# Patient Record
Sex: Female | Born: 1975 | Race: White | Hispanic: No | Marital: Single | Smoking: Current every day smoker
Health system: Southern US, Community
[De-identification: ages and names within clinical notes are randomized; demographics above are authoritative.]

## PROBLEM LIST (undated history)

## (undated) DIAGNOSIS — N739 Female pelvic inflammatory disease, unspecified: Secondary | ICD-10-CM

## (undated) DIAGNOSIS — N946 Dysmenorrhea, unspecified: Secondary | ICD-10-CM

## (undated) DIAGNOSIS — F32A Depression, unspecified: Secondary | ICD-10-CM

## (undated) DIAGNOSIS — B977 Papillomavirus as the cause of diseases classified elsewhere: Secondary | ICD-10-CM

## (undated) DIAGNOSIS — F419 Anxiety disorder, unspecified: Secondary | ICD-10-CM

## (undated) DIAGNOSIS — Z8742 Personal history of other diseases of the female genital tract: Secondary | ICD-10-CM

## (undated) DIAGNOSIS — F17210 Nicotine dependence, cigarettes, uncomplicated: Secondary | ICD-10-CM

## (undated) DIAGNOSIS — K589 Irritable bowel syndrome without diarrhea: Secondary | ICD-10-CM

## (undated) DIAGNOSIS — G43909 Migraine, unspecified, not intractable, without status migrainosus: Secondary | ICD-10-CM

## (undated) HISTORY — DX: Female pelvic inflammatory disease, unspecified: N73.9

## (undated) HISTORY — DX: Papillomavirus as the cause of diseases classified elsewhere: B97.7

## (undated) HISTORY — DX: Dysmenorrhea, unspecified: N94.6

## (undated) HISTORY — DX: Anxiety disorder, unspecified: F41.9

## (undated) HISTORY — DX: Depression, unspecified: F32.A

## (undated) HISTORY — DX: Migraine, unspecified, not intractable, without status migrainosus: G43.909

## (undated) HISTORY — DX: Personal history of other diseases of the female genital tract: Z87.42

## (undated) HISTORY — DX: Nicotine dependence, cigarettes, uncomplicated: F17.210

## (undated) HISTORY — PX: CERVICAL BIOPSY  W/ LOOP ELECTRODE EXCISION: SUR135

## (undated) HISTORY — DX: Irritable bowel syndrome without diarrhea: K58.9

---

## 1998-07-16 ENCOUNTER — Ambulatory Visit (HOSPITAL_COMMUNITY): Admission: RE | Admit: 1998-07-16 | Discharge: 1998-07-16 | Payer: Self-pay | Admitting: *Deleted

## 1998-07-23 ENCOUNTER — Other Ambulatory Visit: Admission: RE | Admit: 1998-07-23 | Discharge: 1998-07-23 | Payer: Self-pay | Admitting: Obstetrics & Gynecology

## 1999-10-30 ENCOUNTER — Emergency Department (HOSPITAL_COMMUNITY): Admission: EM | Admit: 1999-10-30 | Discharge: 1999-10-30 | Payer: Self-pay | Admitting: Emergency Medicine

## 2000-02-19 ENCOUNTER — Other Ambulatory Visit: Admission: RE | Admit: 2000-02-19 | Discharge: 2000-02-19 | Payer: Self-pay | Admitting: Gynecology

## 2000-09-06 ENCOUNTER — Other Ambulatory Visit: Admission: RE | Admit: 2000-09-06 | Discharge: 2000-09-06 | Payer: Self-pay | Admitting: Obstetrics and Gynecology

## 2001-02-28 ENCOUNTER — Ambulatory Visit (HOSPITAL_COMMUNITY): Admission: RE | Admit: 2001-02-28 | Discharge: 2001-02-28 | Payer: Self-pay | Admitting: Obstetrics & Gynecology

## 2001-02-28 ENCOUNTER — Encounter: Payer: Self-pay | Admitting: Obstetrics & Gynecology

## 2001-03-03 ENCOUNTER — Inpatient Hospital Stay (HOSPITAL_COMMUNITY): Admission: AD | Admit: 2001-03-03 | Discharge: 2001-03-03 | Payer: Self-pay | Admitting: *Deleted

## 2001-03-18 ENCOUNTER — Inpatient Hospital Stay (HOSPITAL_COMMUNITY): Admission: AD | Admit: 2001-03-18 | Discharge: 2001-03-21 | Payer: Self-pay | Admitting: Obstetrics and Gynecology

## 2001-12-12 ENCOUNTER — Other Ambulatory Visit: Admission: RE | Admit: 2001-12-12 | Discharge: 2001-12-12 | Payer: Self-pay | Admitting: Gynecology

## 2002-08-25 ENCOUNTER — Emergency Department (HOSPITAL_COMMUNITY): Admission: EM | Admit: 2002-08-25 | Discharge: 2002-08-25 | Payer: Self-pay | Admitting: Emergency Medicine

## 2002-08-25 ENCOUNTER — Encounter: Payer: Self-pay | Admitting: Emergency Medicine

## 2002-08-28 ENCOUNTER — Ambulatory Visit (HOSPITAL_COMMUNITY): Admission: RE | Admit: 2002-08-28 | Discharge: 2002-08-28 | Payer: Self-pay | Admitting: Emergency Medicine

## 2002-08-28 ENCOUNTER — Encounter: Payer: Self-pay | Admitting: Emergency Medicine

## 2003-02-05 ENCOUNTER — Other Ambulatory Visit: Admission: RE | Admit: 2003-02-05 | Discharge: 2003-02-05 | Payer: Self-pay | Admitting: Gynecology

## 2004-06-24 ENCOUNTER — Emergency Department (HOSPITAL_COMMUNITY): Admission: EM | Admit: 2004-06-24 | Discharge: 2004-06-24 | Payer: Self-pay | Admitting: *Deleted

## 2004-07-01 ENCOUNTER — Ambulatory Visit (HOSPITAL_COMMUNITY): Admission: RE | Admit: 2004-07-01 | Discharge: 2004-07-01 | Payer: Self-pay | Admitting: Obstetrics and Gynecology

## 2005-01-01 ENCOUNTER — Other Ambulatory Visit: Admission: RE | Admit: 2005-01-01 | Discharge: 2005-01-01 | Payer: Self-pay | Admitting: Gynecology

## 2006-03-25 ENCOUNTER — Ambulatory Visit: Payer: Self-pay | Admitting: Family Medicine

## 2006-05-06 ENCOUNTER — Ambulatory Visit: Payer: Self-pay | Admitting: Obstetrics & Gynecology

## 2006-05-06 ENCOUNTER — Other Ambulatory Visit: Admission: RE | Admit: 2006-05-06 | Discharge: 2006-05-06 | Payer: Self-pay | Admitting: Obstetrics and Gynecology

## 2006-05-06 ENCOUNTER — Encounter (INDEPENDENT_AMBULATORY_CARE_PROVIDER_SITE_OTHER): Payer: Self-pay | Admitting: Specialist

## 2006-05-20 ENCOUNTER — Ambulatory Visit: Payer: Self-pay | Admitting: Obstetrics & Gynecology

## 2006-12-07 HISTORY — PX: LEEP: SHX91

## 2007-02-18 ENCOUNTER — Ambulatory Visit: Payer: Self-pay | Admitting: Family Medicine

## 2007-06-20 ENCOUNTER — Ambulatory Visit: Payer: Self-pay | Admitting: Family Medicine

## 2007-08-26 ENCOUNTER — Ambulatory Visit: Payer: Self-pay | Admitting: Family Medicine

## 2007-09-12 ENCOUNTER — Ambulatory Visit: Payer: Self-pay | Admitting: Family Medicine

## 2007-12-05 ENCOUNTER — Ambulatory Visit: Payer: Self-pay | Admitting: Family Medicine

## 2008-02-29 ENCOUNTER — Ambulatory Visit: Payer: Self-pay | Admitting: Family Medicine

## 2008-03-08 ENCOUNTER — Other Ambulatory Visit: Admission: RE | Admit: 2008-03-08 | Discharge: 2008-03-08 | Payer: Self-pay | Admitting: Family Medicine

## 2008-03-08 ENCOUNTER — Ambulatory Visit: Payer: Self-pay | Admitting: Family Medicine

## 2008-03-13 ENCOUNTER — Encounter: Admission: RE | Admit: 2008-03-13 | Discharge: 2008-03-13 | Payer: Self-pay | Admitting: Family Medicine

## 2008-03-21 ENCOUNTER — Ambulatory Visit: Payer: Self-pay | Admitting: Family Medicine

## 2008-05-16 ENCOUNTER — Ambulatory Visit: Payer: Self-pay | Admitting: Family Medicine

## 2008-06-13 ENCOUNTER — Ambulatory Visit: Payer: Self-pay | Admitting: Family Medicine

## 2008-06-21 ENCOUNTER — Ambulatory Visit: Payer: Self-pay | Admitting: Family Medicine

## 2008-07-12 ENCOUNTER — Ambulatory Visit: Payer: Self-pay | Admitting: Family Medicine

## 2008-08-09 ENCOUNTER — Ambulatory Visit: Payer: Self-pay | Admitting: Family Medicine

## 2008-08-21 ENCOUNTER — Ambulatory Visit: Payer: Self-pay | Admitting: Family Medicine

## 2009-01-01 ENCOUNTER — Ambulatory Visit: Payer: Self-pay | Admitting: Family Medicine

## 2009-01-30 ENCOUNTER — Ambulatory Visit: Payer: Self-pay | Admitting: Family Medicine

## 2009-02-12 ENCOUNTER — Ambulatory Visit: Payer: Self-pay | Admitting: Family Medicine

## 2009-03-21 ENCOUNTER — Ambulatory Visit: Payer: Self-pay | Admitting: Family Medicine

## 2009-08-15 ENCOUNTER — Emergency Department (HOSPITAL_COMMUNITY): Admission: EM | Admit: 2009-08-15 | Discharge: 2009-08-15 | Payer: Self-pay | Admitting: Emergency Medicine

## 2010-01-21 ENCOUNTER — Ambulatory Visit: Payer: Self-pay | Admitting: Family Medicine

## 2011-04-24 NOTE — Group Therapy Note (Signed)
NAMEMarland Kitchen  Kristine, Holder NO.:  [id number]   MEDICAL RECORD NO.:  [id number]          PATIENT TYPE:  WOC   LOCATION:  [facility]                   FACILITY:  [facility]   PHYSICIAN:  Tinnie Gens, MD        DATE OF BIRTH:  10-Dec-1975   DATE OF SERVICE:  03/25/2006                                    CLINIC NOTE   CHIEF COMPLAINT:  Referral for LEEP.   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  The patient was  referred today for LEEP.  She is without significant complaints today.   PAST MEDICAL HISTORY:  Negative.   PAST SURGICAL HISTORY:  Negative.   MEDICATIONS:  Depo-Provera.   ALLERGIES:  No known drug allergies.   PAST OBSTETRICAL HISTORY:  She is gravida 2, para 1, with one vaginal  delivery.   GYN HISTORY:  Menarche at age 41, cycles are irregular now that she is on  Depo.  She does have a history of abnormal Paps in the past.   FAMILY HISTORY:  Negative.   SOCIAL HISTORY:  The patient smokes one pack per day.  She does drink  alcohol, 2-3 drinks per week.   REVIEW OF SYSTEMS:  14-point review of systems reviewed.  Please see GYN  history and the chart was negative.   PHYSICAL EXAMINATION:  VITAL SIGNS:  As noted in the chart.  GENERAL:  She is a well-developed, well-nourished white female in no acute  distress.  ABDOMEN:  Soft, nontender, and nondistended.   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   PLAN:  LEEP procedure.  The patient has already reviewed the procedure.  She  will return for a LEEP in the next few weeks.  A limited discussion was also  had with the patient regarding implications of abnormal Pap, HPV effect,  prevention, and follow-up following LEEP.  The patient understands all of  this.  Thank you for this referral.     ______________________________  Tinnie Gens, MD     TP/MEDQ  D:  03/25/2006  T:  03/26/2006  Job:  [id number]   cc:   [facility]

## 2011-04-24 NOTE — Group Therapy Note (Signed)
NAMEMarland Kitchen  Kristine Holder, Kristine Holder NO.:  [id number]   MEDICAL RECORD NO.:  [id number]          PATIENT TYPE:  WOC   LOCATION:  [facility]                   FACILITY:  [facility]   PHYSICIAN:  Dorthula Perfect, MD     DATE OF BIRTH:  [birth year]   DATE OF SERVICE:  05/06/2006                                    CLINIC NOTE   SUBJECTIVE:  This 35 year old white female, para 1, who uses Depo-Provera  for contraception and has not had a menstrual period in some time, returns  today for LEEP.  She had colposcopy done a number of months ago after an  abnormal Pap smear.  Directed biopsies at both 6 and 11 o'clock,  respectively, revealed high-grade squamous lesion, CIN-2, and the cervical  curettings were normal.  The patient has seen the LEEP video.  Her pregnancy  test today is negative.   DESCRIPTION OF PROCEDURE:  Laser speculum is inserted and the cervix  visualized.  Acetic acid was applied and colposcopy revealed some  abnormalities at 6 and 11 o'clock.  The cervix was then injected  circumferentially beginning at 6 o'clock with 2% lidocaine with epinephrine.  After 4 minutes or so to allow adequate absorption, the LEEP was done.  The  20-mm electrode with the setting of 50/50 starting at 3 o'clock and going  across and deeper and exiting at 9 o'clock was performed.  The tissue was  removed in 2-3 fragments.  The ball electrode was then used to coagulate the  base.  Thick Monsel solution was then applied.  Bleeding was negligible.   DIAGNOSIS:  Cervical intraepithelial neoplasia, grade 2.   PROCEDURE:  Loop electrosurgical excision procedure.   DISPOSITION:  The patient will be rechecked in 2 weeks.  She was instructed  to refrain for sexual intercourse.  If she has an abnormal amount of heavy  bleeding, she is to come back to be checked.           ______________________________  Dorthula Perfect, MD     ER/MEDQ  D:  05/06/2006  T:  05/07/2006  Job:  [id number]

## 2011-04-24 NOTE — Group Therapy Note (Signed)
NAMEMarland Kitchen  Kristine, Holder NO.:  [id number]   MEDICAL RECORD NO.:  [id number]          PATIENT TYPE:  WOC   LOCATION:  [facility]                   FACILITY:  [facility]   PHYSICIAN:  Dorthula Perfect, MD     DATE OF BIRTH:  [birth year]   DATE OF SERVICE:  05/20/2006                                    CLINIC NOTE   A 35 year old white female, para 1, had a LEEP procedure done here on May  31st. It was performed for CIN-II disease. Pathology revealed a high-grade  squamous lesion, the margins of which were free. The specimen was  fragmented.   The patient does not have a menstrual period because of Depo-Provera. She  does have a  malodorous vaginal discharge. She has no vaginal spotting.   Speculum examination reveals the cervix to healing well from the LEEP. There  is no bleeding noted. There is a malodorous discharge. The uterus is normal  size and shape, and is nontender.   IMPRESSION:  Cervical dysphagia, CIN-II, status post LEEP.   DISPOSITION:  1.  Sulfa vaginal cream, half an applicator at bedtime for the next two      weeks.  2.  Return for Pap smear in three to four months.  3.  No sexual intercourse for the next two to four weeks.           ______________________________  Dorthula Perfect, MD     ER/MEDQ  D:  05/20/2006  T:  05/20/2006  Job:  [id number]

## 2011-10-20 LAB — HM HIV SCREENING LAB: HM HIV Screening: NEGATIVE

## 2014-04-02 DIAGNOSIS — J309 Allergic rhinitis, unspecified: Secondary | ICD-10-CM | POA: Insufficient documentation

## 2014-10-18 LAB — HM PAP SMEAR

## 2016-12-14 DIAGNOSIS — J0100 Acute maxillary sinusitis, unspecified: Secondary | ICD-10-CM | POA: Diagnosis not present

## 2016-12-14 DIAGNOSIS — H66002 Acute suppurative otitis media without spontaneous rupture of ear drum, left ear: Secondary | ICD-10-CM | POA: Diagnosis not present

## 2016-12-14 DIAGNOSIS — J209 Acute bronchitis, unspecified: Secondary | ICD-10-CM | POA: Diagnosis not present

## 2016-12-17 DIAGNOSIS — R05 Cough: Secondary | ICD-10-CM | POA: Diagnosis not present

## 2016-12-17 DIAGNOSIS — H66002 Acute suppurative otitis media without spontaneous rupture of ear drum, left ear: Secondary | ICD-10-CM | POA: Diagnosis not present

## 2016-12-17 DIAGNOSIS — J0100 Acute maxillary sinusitis, unspecified: Secondary | ICD-10-CM | POA: Diagnosis not present

## 2017-01-08 DIAGNOSIS — J0100 Acute maxillary sinusitis, unspecified: Secondary | ICD-10-CM | POA: Diagnosis not present

## 2017-04-01 DIAGNOSIS — L508 Other urticaria: Secondary | ICD-10-CM | POA: Diagnosis not present

## 2017-09-08 DIAGNOSIS — R350 Frequency of micturition: Secondary | ICD-10-CM | POA: Diagnosis not present

## 2017-09-08 DIAGNOSIS — L0100 Impetigo, unspecified: Secondary | ICD-10-CM | POA: Diagnosis not present

## 2017-09-08 DIAGNOSIS — J0100 Acute maxillary sinusitis, unspecified: Secondary | ICD-10-CM | POA: Diagnosis not present

## 2017-09-23 DIAGNOSIS — R300 Dysuria: Secondary | ICD-10-CM | POA: Diagnosis not present

## 2017-09-28 ENCOUNTER — Encounter (HOSPITAL_COMMUNITY): Payer: Self-pay | Admitting: Emergency Medicine

## 2017-09-28 ENCOUNTER — Emergency Department (HOSPITAL_COMMUNITY): Payer: 59

## 2017-09-28 ENCOUNTER — Emergency Department (HOSPITAL_COMMUNITY)
Admission: EM | Admit: 2017-09-28 | Discharge: 2017-09-29 | Disposition: A | Discharge status: Home / Self Care | Payer: 59 | Attending: Emergency Medicine | Admitting: Emergency Medicine

## 2017-09-28 DIAGNOSIS — N10 Acute tubulo-interstitial nephritis: Secondary | ICD-10-CM | POA: Diagnosis not present

## 2017-09-28 DIAGNOSIS — N12 Tubulo-interstitial nephritis, not specified as acute or chronic: Secondary | ICD-10-CM

## 2017-09-28 DIAGNOSIS — M545 Low back pain: Secondary | ICD-10-CM | POA: Diagnosis present

## 2017-09-28 DIAGNOSIS — R1031 Right lower quadrant pain: Secondary | ICD-10-CM | POA: Insufficient documentation

## 2017-09-28 DIAGNOSIS — F17210 Nicotine dependence, cigarettes, uncomplicated: Secondary | ICD-10-CM | POA: Diagnosis not present

## 2017-09-28 LAB — URINALYSIS, ROUTINE W REFLEX MICROSCOPIC
Bilirubin Urine: NEGATIVE
Glucose, UA: NEGATIVE mg/dL
Hgb urine dipstick: NEGATIVE
Ketones, ur: NEGATIVE mg/dL
Nitrite: NEGATIVE
Protein, ur: NEGATIVE mg/dL
Specific Gravity, Urine: 1.013 (ref 1.005–1.030)
pH: 7 (ref 5.0–8.0)

## 2017-09-28 LAB — CBC WITH DIFFERENTIAL/PLATELET
Basophils Absolute: 0 10*3/uL (ref 0.0–0.1)
Basophils Relative: 1 %
Eosinophils Absolute: 0.3 10*3/uL (ref 0.0–0.7)
Eosinophils Relative: 3 %
HCT: 39.5 % (ref 36.0–46.0)
Hemoglobin: 13.3 g/dL (ref 12.0–15.0)
Lymphocytes Relative: 39 %
Lymphs Abs: 3.4 10*3/uL (ref 0.7–4.0)
MCH: 30.1 pg (ref 26.0–34.0)
MCHC: 33.7 g/dL (ref 30.0–36.0)
MCV: 89.4 fL (ref 78.0–100.0)
Monocytes Absolute: 0.7 10*3/uL (ref 0.1–1.0)
Monocytes Relative: 9 %
Neutro Abs: 4.3 10*3/uL (ref 1.7–7.7)
Neutrophils Relative %: 48 %
Platelets: 356 10*3/uL (ref 150–400)
RBC: 4.42 MIL/uL (ref 3.87–5.11)
RDW: 13.7 % (ref 11.5–15.5)
WBC: 8.7 10*3/uL (ref 4.0–10.5)

## 2017-09-28 LAB — COMPREHENSIVE METABOLIC PANEL
ALT: 25 U/L (ref 14–54)
AST: 25 U/L (ref 15–41)
Albumin: 4.2 g/dL (ref 3.5–5.0)
Alkaline Phosphatase: 46 U/L (ref 38–126)
Anion gap: 7 (ref 5–15)
BUN: 12 mg/dL (ref 6–20)
CO2: 25 mmol/L (ref 22–32)
Calcium: 9.6 mg/dL (ref 8.9–10.3)
Chloride: 104 mmol/L (ref 101–111)
Creatinine, Ser: 0.75 mg/dL (ref 0.44–1.00)
GFR calc Af Amer: >60 mL/min (ref >60)
GFR calc non Af Amer: >60 mL/min (ref >60)
Glucose, Bld: 87 mg/dL (ref 65–99)
Potassium: 4 mmol/L (ref 3.5–5.1)
Sodium: 136 mmol/L (ref 135–145)
Total Bilirubin: 0.4 mg/dL (ref 0.3–1.2)
Total Protein: 7.1 g/dL (ref 6.5–8.1)

## 2017-09-28 LAB — LIPASE, BLOOD: Lipase: 47 U/L (ref 11–51)

## 2017-09-28 LAB — I-STAT BETA HCG BLOOD, ED (MC, WL, AP ONLY): I-stat hCG, quantitative: <5 m[IU]/mL (ref <5)

## 2017-09-28 MED ORDER — TRAMADOL HCL 50 MG PO TABS: 50.0000 mg | ORAL_TABLET | Freq: Four times a day (QID) | ORAL | 0 refills | Status: DC | PRN

## 2017-09-28 MED ORDER — SODIUM CHLORIDE 0.9 % IV BOLUS (SEPSIS)
1000.0000 mL | Freq: Once | INTRAVENOUS | Status: AC
  Administered 2017-09-28: 1000 mL via INTRAVENOUS

## 2017-09-28 MED ORDER — IOPAMIDOL (ISOVUE-300) INJECTION 61%
INTRAVENOUS | Status: AC
  Filled 2017-09-28: qty 100

## 2017-09-28 MED ORDER — ONDANSETRON HCL 4 MG/2ML IJ SOLN
4.0000 mg | Freq: Once | INTRAMUSCULAR | Status: AC
  Administered 2017-09-28: 4 mg via INTRAVENOUS
  Filled 2017-09-28: qty 2

## 2017-09-28 MED ORDER — MORPHINE SULFATE (PF) 4 MG/ML IV SOLN
4.0000 mg | Freq: Once | INTRAVENOUS | Status: AC
  Administered 2017-09-28: 4 mg via INTRAVENOUS
  Filled 2017-09-28: qty 1

## 2017-09-28 MED ORDER — DEXTROSE 5 % IV SOLN
1.0000 g | Freq: Once | INTRAVENOUS | Status: AC
  Administered 2017-09-28: 1 g via INTRAVENOUS
  Filled 2017-09-28: qty 10

## 2017-09-28 MED ORDER — IOPAMIDOL (ISOVUE-300) INJECTION 61%
INTRAVENOUS | Status: DC
Start: 2017-09-28 — End: 2017-09-29
  Filled 2017-09-28: qty 30

## 2017-09-28 MED ORDER — CEPHALEXIN 500 MG PO CAPS: 500.0000 mg | ORAL_CAPSULE | Freq: Four times a day (QID) | ORAL | 0 refills | Status: AC

## 2017-09-28 NOTE — Discharge Instructions (Signed)
There is evidence of a kidney infection called pyelonephritis. Please be sure to stay well hydrated by drinking plenty of water. Tried to have a goal of at least a half liter of water an hour.   Please stop taking the Cipro. Begin taking the cephalexin (Keflex).  Please take all of your antibiotics until finished!   You may develop abdominal discomfort or diarrhea from the antibiotic.  You may help offset this with probiotics which you can buy or get in yogurt. Do not eat or take the probiotics until 2 hours after your antibiotic.   Antiinflammatory medications: Take 600 mg of ibuprofen every 6 hours or 440 mg (over the counter dose) to 500 mg (prescription dose) of naproxen every 12 hours for the next 3 days. After this time, these medications may be used as needed for pain. Take these medications with food to avoid upset stomach. Choose only one of these medications, do not take them together. Tylenol: Should you continue to have additional pain while taking the ibuprofen or naproxen, you may add in tylenol as needed. Your daily total maximum amount of tylenol from all sources should be limited to 4000mg /day for persons without liver problems, or 2000mg /day for those with liver problems. Tramadol: May use the tramadol for severe pain. Do not drive or perform other dangerous activities while taking the tramadol.  Follow-up with your primary care provider next week. Return to the ED should symptoms worsen or you begin to have fever, vomiting, difficulty urinating, increasing pain, or any other major concerns.

## 2017-09-28 NOTE — ED Provider Notes (Signed)
[facility]-EMERGENCY DEPT Provider Note   CSN: [id number] Arrival date & time: 09/28/17  1524     History   Chief Complaint Chief Complaint  Patient presents with  . Flank Pain    HPI Kristine Holder is a 41 y.o. female.  HPI   Kristine Holder is a 41 y.o. female, presenting to the ED with right lower back pain for the last two weeks. Pain started in the right lower quadrant it has since moved to sit mostly in the right lower back, radiating into the right flank, RLQ, and suprapubic region.  Also endorses urinary frequency, intermittent subjective fever, and nausea. Initially thought she had an UTI, was seen by PCP on Oct 18, and prescribed Cipro 500 mg BID. Symptoms have worsened since that time. Pain is rated 8/10, alternates between a dullness and sharp, stabbing pain.  Denies hematuria, vomiting, diarrhea (except from ABX), abnormal vaginal discharge/bleeding, hematochezia/melena, anorexia, CP, SOB, or any other complaints.   Does not have a menstrual cycle due to Depo. Last food was around 5 pm this evening.     History reviewed. No pertinent past medical history.  There are no active problems to display for this patient.   History reviewed. No pertinent surgical history.  OB History    No data available       Home Medications    Prior to Admission medications   Medication Sig Start Date End Date Taking? Authorizing Provider  cephALEXin (KEFLEX) 500 MG capsule Take 1 capsule (500 mg total) by mouth 4 (four) times daily. 09/28/17 10/12/17  Joy, Shawn C, PA-C  traMADol (ULTRAM) 50 MG tablet Take 1 tablet (50 mg total) by mouth every 6 (six) hours as needed. 09/28/17   Joy, Hillard Danker, PA-C    Family History No family history on file.  Social History Social History  Substance Use Topics  . Smoking status: Current Every Day Smoker    Types: Cigarettes  . Smokeless tobacco: Never Used  . Alcohol use No     Allergies    Prednisone   Review of Systems Review of Systems  Constitutional: Positive for fever (intermittent, subjective). Negative for chills and diaphoresis.  Respiratory: Negative for shortness of breath.   Cardiovascular: Negative for chest pain.  Gastrointestinal: Positive for abdominal pain and nausea. Negative for blood in stool and vomiting.  Genitourinary: Positive for flank pain and frequency. Negative for difficulty urinating, dysuria, hematuria, vaginal bleeding and vaginal discharge.  Musculoskeletal: Positive for back pain.  All other systems reviewed and are negative.    Physical Exam Updated Vital Signs BP 126/72 (BP Location: Left Arm)   Pulse 87   Temp 97.9 F (36.6 C) (Oral)   Resp 18   Ht 5\' 2"  (1.575 m)   Wt 47.9 kg (105 lb 8 oz)   SpO2 100%   BMI 19.30 kg/m   Physical Exam  Constitutional: She appears well-developed and well-nourished. No distress.  HENT:  Head: Normocephalic and atraumatic.  Eyes: Conjunctivae are normal.  Neck: Neck supple.  Cardiovascular: Normal rate, regular rhythm, normal heart sounds and intact distal pulses.   Pulmonary/Chest: Effort normal and breath sounds normal. No respiratory distress.  Abdominal: Soft. There is tenderness. There is rebound and CVA tenderness (right). There is no guarding.    Rebound tenderness in the RLQ.  Musculoskeletal: She exhibits no edema.       Arms: Lymphadenopathy:    She has no cervical adenopathy.  Neurological: She is alert.  Skin: Skin is warm and dry. She is not diaphoretic.  Psychiatric: She has a normal mood and affect. Her behavior is normal.  Nursing note and vitals reviewed.    ED Treatments / Results  Labs (all labs ordered are listed, but only abnormal results are displayed) Labs Reviewed  URINALYSIS, ROUTINE W REFLEX MICROSCOPIC - Abnormal; Notable for the following:       Result Value   APPearance HAZY (*)    Leukocytes, UA MODERATE (*)    Bacteria, UA MANY (*)    Squamous  Epithelial / LPF 0-5 (*)    All other components within normal limits  URINE CULTURE  COMPREHENSIVE METABOLIC PANEL  LIPASE, BLOOD  CBC WITH DIFFERENTIAL/PLATELET  I-STAT BETA HCG BLOOD, ED ([facility], [facility], [facility] ONLY)    EKG  EKG Interpretation None       Radiology Ct Abdomen Pelvis W Contrast  Result Date: 09/28/2017 CLINICAL DATA:  Right lower quadrant pain for 2 weeks EXAM: CT ABDOMEN AND PELVIS WITH CONTRAST TECHNIQUE: Multidetector CT imaging of the abdomen and pelvis was performed using the standard protocol following bolus administration of intravenous contrast. CONTRAST:  100 mL Isovue-300 intravenous COMPARISON:  None. FINDINGS: Lower chest: No acute abnormality. Hepatobiliary: No focal liver abnormality is seen. No gallstones, gallbladder wall thickening, or biliary dilatation. Pancreas: Unremarkable. No pancreatic ductal dilatation or surrounding inflammatory changes. Spleen: Normal in size without focal abnormality. Adrenals/Urinary Tract: Adrenal glands are unremarkable. Kidneys are normal, without renal calculi, focal lesion, or hydronephrosis. Bladder is unremarkable. Stomach/Bowel: Stomach is within normal limits. Appendix appears normal. No evidence of bowel wall thickening, distention, or inflammatory changes. Vascular/Lymphatic: No significant vascular findings are present. No enlarged abdominal or pelvic lymph nodes. Reproductive: Uterus and bilateral adnexa are unremarkable. Other: Negative for free air or free fluid. Tiny fat in the umbilicus. Musculoskeletal: No acute or significant osseous findings. IMPRESSION: No CT evidence for acute intra-abdominal or pelvic abnormality. Negative appendix. Electronically Signed   By: Jasmine Pang M.D.   On: 09/28/2017 22:24    Procedures Procedures (including critical care time)  Medications Ordered in ED Medications  iopamidol (ISOVUE-300) 61 % injection (not administered)  iopamidol (ISOVUE-300) 61 % injection (not administered)   sodium chloride 0.9 % bolus 1,000 mL (0 mLs Intravenous Stopped 09/28/17 2146)  ondansetron (ZOFRAN) injection 4 mg (4 mg Intravenous Given 09/28/17 2008)  morphine 4 MG/ML injection 4 mg (4 mg Intravenous Given 09/28/17 2008)  morphine 4 MG/ML injection 4 mg (4 mg Intravenous Given 09/28/17 2145)  cefTRIAXone (ROCEPHIN) 1 g in dextrose 5 % 50 mL IVPB (0 g Intravenous Stopped 09/29/17 0004)     Initial Impression / Assessment and Plan / ED Course  I have reviewed the triage vital signs and the nursing notes.  Pertinent labs & imaging results that were available during my care of the patient were reviewed by me and considered in my medical decision making (see chart for details).  Clinical Course as of Sep 29 6  Tue Sep 28, 2017  2045 Patient states her pain is now 7/10.   [SJ]    Clinical Course User Index [SJ] Joy, Shawn C, PA-C    Patient presents with worsening flank pain. Patient is nontoxic appearing, afebrile, not tachycardic, not tachypneic, not hypotensive, and is in no apparent distress. No leukocytosis. Suspect pyelonephritis, supported by UA findings. Discontinue Cipro and begin Keflex. PCP follow-up. The patient was given instructions for home care as well as return precautions. Patient voices understanding of these  instructions, accepts the plan, and is comfortable with discharge.  Findings and plan of care discussed with Theda Belfasthris Tegeler, MD.    Vitals:   09/28/17 1845 09/28/17 2145 09/28/17 2311 09/29/17 0001  BP: 140/75 130/80 119/60 118/69  Pulse: 80 66 69 77  Resp: 18 14 18 18   Temp: 98.3 F (36.8 C)   97.7 F (36.5 C)  TempSrc: Oral   Oral  SpO2: 100% 98% 98% 99%  Weight:      Height:         Final Clinical Impressions(s) / ED Diagnoses   Final diagnoses:  Pyelonephritis    New Prescriptions New Prescriptions   CEPHALEXIN (KEFLEX) 500 MG CAPSULE    Take 1 capsule (500 mg total) by mouth 4 (four) times daily.   TRAMADOL (ULTRAM) 50 MG TABLET     Take 1 tablet (50 mg total) by mouth every 6 (six) hours as needed.     Anselm PancoastJoy, Shawn C, PA-C 09/29/17 0015    Tegeler, Canary Brimhristopher J, MD 09/29/17 [phone]

## 2017-09-28 NOTE — ED Triage Notes (Signed)
Patient c/o right flank pain that has been ongoing since last Wed. Saw her doctor who ruled out UTI but since pain persist told to come to ED for further work up.

## 2017-09-30 LAB — URINE CULTURE: Culture: NO GROWTH

## 2017-12-01 DIAGNOSIS — J0140 Acute pansinusitis, unspecified: Secondary | ICD-10-CM | POA: Diagnosis not present

## 2018-01-13 DIAGNOSIS — Z3042 Encounter for surveillance of injectable contraceptive: Secondary | ICD-10-CM

## 2018-01-13 HISTORY — DX: Encounter for surveillance of injectable contraceptive: Z30.42

## 2018-05-17 ENCOUNTER — Ambulatory Visit (INDEPENDENT_AMBULATORY_CARE_PROVIDER_SITE_OTHER): Payer: 59 | Admitting: Family Medicine

## 2018-05-17 ENCOUNTER — Other Ambulatory Visit: Payer: Self-pay

## 2018-05-17 ENCOUNTER — Encounter: Payer: Self-pay | Admitting: Family Medicine

## 2018-05-17 VITALS — BP 110/70 | HR 82 | Temp 98.2°F | Ht 64.0 in | Wt 105.2 lb

## 2018-05-17 DIAGNOSIS — M7711 Lateral epicondylitis, right elbow: Secondary | ICD-10-CM

## 2018-05-17 DIAGNOSIS — K580 Irritable bowel syndrome with diarrhea: Secondary | ICD-10-CM

## 2018-05-17 DIAGNOSIS — R0789 Other chest pain: Secondary | ICD-10-CM | POA: Diagnosis not present

## 2018-05-17 DIAGNOSIS — G5601 Carpal tunnel syndrome, right upper limb: Secondary | ICD-10-CM | POA: Diagnosis not present

## 2018-05-17 DIAGNOSIS — F17210 Nicotine dependence, cigarettes, uncomplicated: Secondary | ICD-10-CM | POA: Diagnosis not present

## 2018-05-17 DIAGNOSIS — Z87898 Personal history of other specified conditions: Secondary | ICD-10-CM

## 2018-05-17 DIAGNOSIS — K589 Irritable bowel syndrome without diarrhea: Secondary | ICD-10-CM | POA: Insufficient documentation

## 2018-05-17 DIAGNOSIS — Z8742 Personal history of other diseases of the female genital tract: Secondary | ICD-10-CM

## 2018-05-17 HISTORY — DX: Personal history of other diseases of the female genital tract: Z87.42

## 2018-05-17 HISTORY — DX: Irritable bowel syndrome, unspecified: K58.9

## 2018-05-17 HISTORY — DX: Nicotine dependence, cigarettes, uncomplicated: F17.210

## 2018-05-17 NOTE — Patient Instructions (Addendum)
It was so good seeing you again! Thank you for establishing with my new practice and allowing me to continue caring for you. It means a lot to me.   Please schedule a follow up appointment with me in 4 weeks for your annual complete physical; please come fasting.  Start icing your elbow and wearing a tennis elbow strap at work and a wrist splint at night while you sleep.  Take the diclofenac twice a day for two weeks.  Return for recheck if your pain persists.   Please go to our [facility] office to get your xrays done. You can walk in M-F between 8am and 5pm. Tell them you are there for xrays ordered by me. They will send me the results, then I will let you know the results with instructions.   Address: [address] [place], [place], [place] [phone]  (office sits at [address] at [address] [employer] intersection; from here, turn left onto [address] take to [address], turn right and go for a mile or so, office will be on left across form [address] )  Tennis Elbow Tennis elbow (lateral epicondylitis) is inflammation of the outer tendons of your forearm close to your elbow. Your tendons attach your muscles to your bones. The outer tendons of your forearm are used to extend your wrist, and they attach on the outside part of your elbow. Tennis elbow is often found in people who play tennis, but anyone may get the condition from repeatedly extending the wrist or turning the forearm. What are the causes? This condition is caused by repeatedly extending your wrist and using your hands. It can result from sports or work that requires repetitive forearm movements. Tennis elbow may also be caused by an injury. What increases the risk? You have a higher risk of developing tennis elbow if you play tennis or another racquet sport. You also have a higher risk if you frequently use your hands for work. This condition is also more likely to develop  in:  Musicians.  Carpenters, painters, and plumbers.  Cooks.  Cashiers.  People who work in [employer].  Holiday representative workers.  Butchers.  People who use computers.  What are the signs or symptoms? Symptoms of this condition include:  Pain and tenderness in your forearm and the outer part of your elbow. You may only feel the pain when you use your arm, or you may feel it even when you are not using your arm.  A burning feeling that runs from your elbow through your arm.  Weak grip in your hands.  How is this diagnosed? This condition may be diagnosed by medical history and physical exam. You may also have other tests, including:  X-rays.  MRI.  How is this treated? Your health care provider will recommend lifestyle adjustments, such as resting and icing your arm. Treatment may also include:  Medicines for inflammation. This may include shots of cortisone if your pain continues.  Physical therapy. This may include massage or exercises.  An elbow brace.  Surgery may eventually be recommended if your pain does not go away with treatment. Follow these instructions at home: Activity  Rest your elbow and wrist as directed by your health care provider. Try to avoid any activities that caused the problem until your health care provider says that you can do them again.  If a physical therapist teaches you exercises, do all of them as directed.  If you lift an  object, lift it with your palm facing upward. This lowers the stress on your elbow. Lifestyle  If your tennis elbow is caused by sports, check your equipment and make sure that: ? You are using it correctly. ? It is the best fit for you.  If your tennis elbow is caused by work, take breaks frequently, if you are able. Talk with your manager about how to best perform tasks in a way that is safe. ? If your tennis elbow is caused by computer use, talk with your manager about any changes that can be made to your work  environment. General instructions  If directed, apply ice to the painful area: ? Put ice in a plastic bag. ? Place a towel between your skin and the bag. ? Leave the ice on for 20 minutes, 2-3 times per day.  Take medicines only as directed by your health care provider.  If you were given a brace, wear it as directed by your health care provider.  Keep all follow-up visits as directed by your health care provider. This is important. Contact a health care provider if:  Your pain does not get better with treatment.  Your pain gets worse.  You have numbness or weakness in your forearm, hand, or fingers. This information is not intended to replace advice given to you by your health care provider. Make sure you discuss any questions you have with your health care provider. Document Released: 11/23/2005 Document Revised: 07/23/2016 Document Reviewed: 11/19/2014 Elsevier Interactive Patient Education  2018 [employer]  Carpal Tunnel Syndrome Carpal tunnel syndrome is a condition that causes pain in your hand and arm. The carpal tunnel is a narrow area located on the palm side of your wrist. Repeated wrist motion or certain diseases may cause swelling within the tunnel. This swelling pinches the main nerve in the wrist (median nerve). What are the causes? This condition may be caused by:  Repeated wrist motions.  Wrist injuries.  Arthritis.  A cyst or tumor in the carpal tunnel.  Fluid buildup during pregnancy.  Sometimes the cause of this condition is not known. What increases the risk? This condition is more likely to develop in:  People who have jobs that cause them to repeatedly move their wrists in the same motion, such as Health visitorbutchers and cashiers.  Women.  People with certain conditions, such as: ? Diabetes. ? Obesity. ? An underactive thyroid (hypothyroidism). ? Kidney failure.  What are the signs or symptoms? Symptoms of this condition include:  A tingling  feeling in your fingers, especially in your thumb, index, and middle fingers.  Tingling or numbness in your hand.  An aching feeling in your entire arm, especially when your wrist and elbow are bent for long periods of time.  Wrist pain that goes up your arm to your shoulder.  Pain that goes down into your palm or fingers.  A weak feeling in your hands. You may have trouble grabbing and holding items.  Your symptoms may feel worse during the night. How is this diagnosed? This condition is diagnosed with a medical history and physical exam. You may also have tests, including:  An electromyogram (EMG). This test measures electrical signals sent by your nerves into the muscles.  X-rays.  How is this treated? Treatment for this condition includes:  Lifestyle changes. It is important to stop doing or modify the activity that caused your condition.  Physical or occupational therapy.  Medicines for pain and inflammation. This may include medicine  that is injected into your wrist.  A wrist splint.  Surgery.  Follow these instructions at home: If you have a splint:  Wear it as told by your health care provider. Remove it only as told by your health care provider.  Loosen the splint if your fingers become numb and tingle, or if they turn cold and blue.  Keep the splint clean and dry. General instructions  Take over-the-counter and prescription medicines only as told by your health care provider.  Rest your wrist from any activity that may be causing your pain. If your condition is work related, talk to your employer about changes that can be made, such as getting a wrist pad to use while typing.  If directed, apply ice to the painful area: ? Put ice in a plastic bag. ? Place a towel between your skin and the bag. ? Leave the ice on for 20 minutes, 2-3 times per day.  Keep all follow-up visits as told by your health care provider. This is important.  Do any exercises as  told by your health care provider, physical therapist, or occupational therapist. Contact a health care provider if:  You have new symptoms.  Your pain is not controlled with medicines.  Your symptoms get worse. This information is not intended to replace advice given to you by your health care provider. Make sure you discuss any questions you have with your health care provider. Document Released: 11/20/2000 Document Revised: 04/02/2016 Document Reviewed: 04/10/2015 Elsevier Interactive Patient Education  [employer].

## 2018-05-17 NOTE — Progress Notes (Signed)
Subjective  CC:  Chief Complaint  Patient presents with  . Establish Care    [facility], last physical in 2015, pap due & mammogram, wants to do CPE   . Arm Pain    throbbing and tinging in Right Arm, not conistant x 4-5 months     HPI: Kristine Holder is a 42 y.o. female is a former [facility] patient and is here to reestablish care with me today.   She has the following concerns or needs:  Complains of right arm pain throbbing in multiple locations on and off over the last year or so but has worsened over the last 4 to 5 months.  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  She denies injury.  She reports shoulder pain at times elbow pain and numbness tingling in the fingers, worse at night.  No weakness.  No swelling in any of her joints.  Has not use any medications or treatments.  She is left-handed.  History of abnormal Pap smear: Status post LEEP in 2008, review of medical record shows 2015 with normal Pap with positive HR HPV.  No follow-up was done at that time.  Contraception: Uses Depo-Provera, amenorrheic, smoker.  Not currently sexually active, due next month for injection  Long-term smoker: Reports intermittent and occasional fleeting heaviness in the chest unrelated to exertional activities.  No diaphoresis, nausea or radiation of pain.  Feels it is related to her smoking.  She admits to daily coughing in the morning.  No wheezing or shortness of breath.  Last chest x-ray was about 10 years ago and was normal at that time.  She would like to quit, has failed multiple times with the use of Chantix and vaping.  Smoking is a stress reliever for her.  Health maintenance: Due for complete physical with Pap smear, due for mammogram and lab work.  Assessment  1. Right lateral epicondylitis   2. Cigarette nicotine dependence without complication   3. History of abnormal cervical Pap smear   4. Irritable bowel syndrome with diarrhea   5. Right carpal tunnel syndrome   6.  Atypical chest pain      Plan   Tennis elbow and carpal tunnel syndrome: Educated.  Rice therapy with NSAIDs.  Tennis elbow strap while at work and nighttime wrist splint.  Recheck 2 weeks.  Educational handouts provided  Cigarette dependence with possible related chest pain.  Check chest x-ray for COPD changes.  Counseling for cessation started.  Normal EKG today.  Unlikely to be related to ischemic chest pain.  Check labs at next visit.   Follow up:  Return in about 1 month (around 06/14/2018) for complete physical.  Orders Placed This Encounter  Procedures  . DG Chest 2 View  . HM HIV SCREENING LAB  . HM PAP SMEAR  . EKG 12-Lead   No orders of the defined types were placed in this encounter.     We updated and reviewed the patient's past history in detail and it is documented below.  Patient Active Problem List   Diagnosis Date Noted  . Dependence on nicotine from cigarettes 05/17/2018  . History of abnormal cervical Pap smear 05/17/2018    H/o LEEP 2008 with nl f/u until 2015: nl pap with + HR HPV - no follow up.   . Irritable bowel syndrome (IBS) 05/17/2018  . Encounter for Depo-Provera contraception 01/13/2018  . Allergic rhinitis 04/02/2014   Health Maintenance  Topic Date Due  .  MAMMOGRAM  09/24/1993  . PAP SMEAR  10/18/2017  . INFLUENZA VACCINE  07/07/2018  . TETANUS/TDAP  01/07/2021  . HIV Screening  Completed   Immunization History  Administered Date(s) Administered  . Td 01/07/2011   No outpatient medications have been marked as taking for the 05/17/18 encounter (Office Visit) with Willow Ora, MD.    Allergies: Patient is allergic to prednisone. Past Medical History Patient  has a past medical history of Dependence on nicotine from cigarettes (05/17/2018), History of abnormal cervical Pap smear (05/17/2018), Human papilloma virus (HPV) infection, Irritable bowel syndrome (IBS) (05/17/2018), and Migraine. Past Surgical History Patient  has a past  surgical history that includes LEEP (2008). Family History: Patient family history includes Alcohol abuse in her maternal grandfather and paternal grandfather; COPD in her paternal grandmother; Healthy in her son; Hypertension in her father; Kidney disease in her mother; Learning disabilities in her mother. Social History:  Patient  reports that she has been smoking cigarettes.  She has a 24.00 pack-year smoking history. She has never used smokeless tobacco. She reports that she drinks alcohol. She reports that she does not use drugs.  Review of Systems: Constitutional: negative for fever or malaise Ophthalmic: negative for photophobia, double vision or loss of vision Cardiovascular: negative for chest pain, dyspnea on exertion, or new LE swelling Respiratory: negative for SOB or persistent cough Gastrointestinal: negative for abdominal pain, change in bowel habits or melena Genitourinary: negative for dysuria or gross hematuria Musculoskeletal: negative for new gait disturbance or muscular weakness Integumentary: negative for new or persistent rashes Neurological: negative for TIA or stroke symptoms Psychiatric: negative for SI or delusions Allergic/Immunologic: negative for hives  Patient Care Team    Relationship Specialty Notifications Start End  [facility] PCP - General Family Medicine  09/28/17     Objective  Vitals: BP 110/70   Pulse 82   Temp 98.2 F (36.8 C)   Ht 5\' 4"  (1.626 m)   Wt 105 lb 3.2 oz (47.7 kg)   BMI 18.06 kg/m  General:  Well developed, well nourished, no acute distress  Psych:  Alert and oriented,normal mood and affect Cardiovascular:  RRR without gallop, rub or murmur, nondisplaced PMI Respiratory:  Good breath sounds bilaterally, CTAB with normal respiratory effort Right upper extremity: Normal shoulder exam, tender lateral epicondyles, positive Phalen's, normal strength throughout Skin:  Warm, no rashes or suspicious  lesions noted Neurologic:    Mental status is normal. Gross motor and sensory exams are normal. Normal gait  EKG reviewed, normal sinus rhythm, normal EKG, no comparison  Commons side effects, risks, benefits, and alternatives for medications and treatment plan prescribed today were discussed, and the patient expressed understanding of the given instructions. Patient is instructed to call or message via MyChart if he/she has any questions or concerns regarding our treatment plan. No barriers to understanding were identified. We discussed Red Flag symptoms and signs in detail. Patient expressed understanding regarding what to do in case of urgent or emergency type symptoms.   Medication list was reconciled, printed and provided to the patient in AVS. Patient instructions and summary information was reviewed with the patient as documented in the AVS. This note was prepared with assistance of Dragon voice recognition software. Occasional wrong-word or sound-a-like substitutions may have occurred due to the inherent limitations of voice recognition software

## 2018-05-19 ENCOUNTER — Telehealth: Payer: Self-pay | Admitting: Emergency Medicine

## 2018-05-19 MED ORDER — DICLOFENAC SODIUM 75 MG PO TBEC: 75.0000 mg | DELAYED_RELEASE_TABLET | Freq: Two times a day (BID) | ORAL | 0 refills | Status: DC

## 2018-05-19 NOTE — Addendum Note (Signed)
Addended byDene Gentry: Pharaoh Pio M on: 05/19/2018 03:26 PM   Modules accepted: Orders

## 2018-05-19 NOTE — Telephone Encounter (Signed)
Please advise.    Reason for CRM: patient is calling and states she seen Dr. Mardelle MatteAndy on 05/16/18 and a medication was suppose to be called in to her pharmacy. Patient states she went to pick up the medication and it was not there. She is unsure of the namebut states it was anti- inflammatory med   [id number] - [place], [place] - [address] [facility] [phone] (Phone) [phone] (Fax)     >> May 19, 2018 10:56 AM Tamela OddiMartin, Don'Quashia, NT wrote: Per AVS the medication is diclofenac .

## 2018-05-19 NOTE — Telephone Encounter (Signed)
Please order diclofenac 75mg  po bid # [phone] ) rf.

## 2018-05-19 NOTE — Telephone Encounter (Signed)
Patient informed of Rx. Called to pharmacy

## 2018-05-31 ENCOUNTER — Other Ambulatory Visit: Payer: Self-pay

## 2018-05-31 ENCOUNTER — Encounter: Payer: Self-pay | Admitting: Family Medicine

## 2018-05-31 ENCOUNTER — Other Ambulatory Visit (HOSPITAL_COMMUNITY)
Admission: RE | Admit: 2018-05-31 | Discharge: 2018-05-31 | Disposition: A | Discharge status: Home / Self Care | Payer: 59 | Source: Ambulatory Visit | Attending: Family Medicine | Admitting: Family Medicine

## 2018-05-31 ENCOUNTER — Ambulatory Visit (INDEPENDENT_AMBULATORY_CARE_PROVIDER_SITE_OTHER): Payer: 59 | Admitting: Family Medicine

## 2018-05-31 VITALS — BP 108/72 | HR 66 | Temp 98.4°F | Resp 15 | Ht 63.5 in | Wt 107.6 lb

## 2018-05-31 DIAGNOSIS — Z1239 Encounter for other screening for malignant neoplasm of breast: Secondary | ICD-10-CM

## 2018-05-31 DIAGNOSIS — Z0001 Encounter for general adult medical examination with abnormal findings: Secondary | ICD-10-CM | POA: Diagnosis not present

## 2018-05-31 DIAGNOSIS — F17210 Nicotine dependence, cigarettes, uncomplicated: Secondary | ICD-10-CM

## 2018-05-31 DIAGNOSIS — Z124 Encounter for screening for malignant neoplasm of cervix: Secondary | ICD-10-CM | POA: Diagnosis not present

## 2018-05-31 DIAGNOSIS — M7711 Lateral epicondylitis, right elbow: Secondary | ICD-10-CM | POA: Diagnosis not present

## 2018-05-31 DIAGNOSIS — Z113 Encounter for screening for infections with a predominantly sexual mode of transmission: Secondary | ICD-10-CM | POA: Diagnosis not present

## 2018-05-31 DIAGNOSIS — Z1231 Encounter for screening mammogram for malignant neoplasm of breast: Secondary | ICD-10-CM | POA: Diagnosis not present

## 2018-05-31 DIAGNOSIS — G5601 Carpal tunnel syndrome, right upper limb: Secondary | ICD-10-CM | POA: Diagnosis not present

## 2018-05-31 LAB — CBC WITH DIFFERENTIAL/PLATELET
Basophils Absolute: 0.1 10*3/uL (ref 0.0–0.1)
Basophils Relative: 1.2 % (ref 0.0–3.0)
Eosinophils Absolute: 0.3 10*3/uL (ref 0.0–0.7)
Eosinophils Relative: 4.1 % (ref 0.0–5.0)
HCT: 39.8 % (ref 36.0–46.0)
Hemoglobin: 13.4 g/dL (ref 12.0–15.0)
Lymphocytes Relative: 39.9 % (ref 12.0–46.0)
Lymphs Abs: 3.2 10*3/uL (ref 0.7–4.0)
MCHC: 33.6 g/dL (ref 30.0–36.0)
MCV: 89.5 fl (ref 78.0–100.0)
Monocytes Absolute: 0.7 10*3/uL (ref 0.1–1.0)
Monocytes Relative: 9 % (ref 3.0–12.0)
Neutro Abs: 3.6 10*3/uL (ref 1.4–7.7)
Neutrophils Relative %: 45.8 % (ref 43.0–77.0)
Platelets: 348 10*3/uL (ref 150.0–400.0)
RBC: 4.44 Mil/uL (ref 3.87–5.11)
RDW: 13.1 % (ref 11.5–15.5)
WBC: 7.9 10*3/uL (ref 4.0–10.5)

## 2018-05-31 LAB — COMPREHENSIVE METABOLIC PANEL
ALT: 8 U/L (ref 0–35)
AST: 14 U/L (ref 0–37)
Albumin: 4.5 g/dL (ref 3.5–5.2)
Alkaline Phosphatase: 47 U/L (ref 39–117)
BUN: 11 mg/dL (ref 6–23)
CO2: 27 mEq/L (ref 19–32)
Calcium: 9.6 mg/dL (ref 8.4–10.5)
Chloride: 104 mEq/L (ref 96–112)
Creatinine, Ser: 0.78 mg/dL (ref 0.40–1.20)
GFR: 85.8 mL/min (ref >60.00)
Glucose, Bld: 81 mg/dL (ref 70–99)
Potassium: 4.5 mEq/L (ref 3.5–5.1)
Sodium: 137 mEq/L (ref 135–145)
Total Bilirubin: 0.3 mg/dL (ref 0.2–1.2)
Total Protein: 6.6 g/dL (ref 6.0–8.3)

## 2018-05-31 LAB — LIPID PANEL
Cholesterol: 175 mg/dL (ref 0–200)
HDL: 39.3 mg/dL (ref >39.00)
LDL Cholesterol: 127 mg/dL — ABNORMAL HIGH (ref 0–99)
NonHDL: 135.62
Total CHOL/HDL Ratio: 4
Triglycerides: 43 mg/dL (ref 0.0–149.0)
VLDL: 8.6 mg/dL (ref 0.0–40.0)

## 2018-05-31 NOTE — Progress Notes (Signed)
Subjective  Chief Complaint  Patient presents with  . Annual Exam    Has some questions    HPI: Kristine Holder is a 43 y.o. female who presents to [facility] today for a Female Wellness Visit.   Wellness Visit: annual visit with health maintenance review and exam with Pap   Pap with HR HPV 2015 w/o f/u. On depo-provera for birth control and cycle management. Not taking ca or Vit D. Smoker.   Due for lab work and [employer]	[place]. First.   F/u tennis elbow and left cts: both better. No longer needing nsaids. Has tennis elbow strap.  Assessment  1. Encounter for routine adult physical exam with abnormal findings   2. Cervical cancer screening   3. Cigarette nicotine dependence without complication   4. Breast cancer screening   5. Right lateral epicondylitis   6. Right carpal tunnel syndrome      Plan  Female Wellness Visit:  Age appropriate Health Maintenance and Prevention measures were discussed with patient. Included topics are cancer screening recommendations, ways to keep healthy (see AVS) including dietary and exercise recommendations, regular eye and dental care, use of seat belts, and avoidance of moderate alcohol use and tobacco use.  Pap with HR HPV done: to gyn if remains abnl. mammo ordered.  BMI: discussed patient's BMI and encouraged positive lifestyle modifications to help get to or maintain a target BMI.  HM needs and immunizations were addressed and ordered. See below for orders. See HM and immunization section for updates.  Routine labs and screening tests ordered including cmp, cbc and lipids where appropriate.  Discussed recommendations regarding Vit D and calcium supplementation (see AVS) - rec starting due to long-term depo-provera use and smoker.   precontemplative about quitting smoking.   Tennis elbow and cts improved. Work on Insurance claims handler.   Follow up: No follow-ups on file.   Orders Placed This Encounter  Procedures  .  MM DIGITAL SCREENING BILATERAL  . CBC with Differential/Platelet  . Comprehensive metabolic panel  . Lipid panel  . HIV antibody   No orders of the defined types were placed in this encounter.    Lifestyle: Body mass index is 18.76 kg/m. Wt Readings from Last 3 Encounters:  05/31/18 107 lb 9.6 oz (48.8 kg)  05/17/18 105 lb 3.2 oz (47.7 kg)  09/28/17 105 lb 8 oz (47.9 kg)   Diet: low fat Exercise: intermittently,   Patient Active Problem List   Diagnosis Date Noted  . Dependence on nicotine from cigarettes 05/17/2018  . History of abnormal cervical Pap smear 05/17/2018    H/o LEEP 2008 with nl f/u until 2015: nl pap with + HR HPV - no follow up.   . Irritable bowel syndrome (IBS) 05/17/2018  . Encounter for Depo-Provera contraception 01/13/2018  . Allergic rhinitis 04/02/2014   Health Maintenance  Topic Date Due  . MAMMOGRAM  09/24/1993  . PAP SMEAR  10/18/2017  . INFLUENZA VACCINE  07/07/2018  . TETANUS/TDAP  01/07/2021  . HIV Screening  Completed   Immunization History  Administered Date(s) Administered  . Td 01/07/2011   We updated and reviewed the patient's past history in detail and it is documented below. Allergies: Patient is allergic to prednisone. Past Medical History Patient  has a past medical history of Dependence on nicotine from cigarettes (05/17/2018), History of abnormal cervical Pap smear (05/17/2018), Human papilloma virus (HPV) infection, Irritable bowel syndrome (IBS) (05/17/2018), and Migraine. Past Surgical History Patient  has a  past surgical history that includes LEEP (2008). Family History: Patient family history includes Alcohol abuse in her maternal grandfather and paternal grandfather; COPD in her paternal grandmother; Healthy in her son; Hypertension in her father; Kidney disease in her mother; Learning disabilities in her mother. Social History:  Patient  reports that she has been smoking cigarettes.  She has a 24.00 pack-year smoking  history. She has never used smokeless tobacco. She reports that she drinks alcohol. She reports that she does not use drugs.  Review of Systems: Constitutional: negative for fever or malaise Ophthalmic: negative for photophobia, double vision or loss of vision Cardiovascular: negative for chest pain, dyspnea on exertion, or new LE swelling Respiratory: negative for SOB or persistent cough Gastrointestinal: negative for abdominal pain, change in bowel habits or melena Genitourinary: negative for dysuria or gross hematuria, no abnormal uterine bleeding or disharge Musculoskeletal: negative for new gait disturbance or muscular weakness Integumentary: negative for new or persistent rashes, no breast lumps Neurological: negative for TIA or stroke symptoms Psychiatric: negative for SI or delusions Allergic/Immunologic: negative for hives  Patient Care Team    Relationship Specialty Notifications Start End  [name], Atlantis Delong L, MD PCP - General Family Medicine  05/17/18     Objective  Vitals: BP 108/72   Pulse 66   Temp 98.4 F (36.9 C) (Oral)   Resp 15   Ht 5' 3.5" (1.613 m)   Wt 107 lb 9.6 oz (48.8 kg)   SpO2 99%   BMI 18.76 kg/m  General:  Well developed, well nourished, no acute distress  Psych:  Alert and orientedx3,normal mood and affect HEENT:  Normocephalic, atraumatic, non-icteric sclera, PERRL, oropharynx is clear without mass or exudate, supple neck without adenopathy, mass or thyromegaly Cardiovascular:  Normal S1, S2, RRR without gallop, rub or murmur, nondisplaced PMI Respiratory:  Good breath sounds bilaterally, CTAB with normal respiratory effort Gastrointestinal: normal bowel sounds, soft, non-tender, no noted masses. No HSM MSK: no deformities, contusions. Joints are without erythema or swelling. Spine and CVA region are nontender. Left epicondyle of elbow less tender.  Skin:  Warm, no rashes or suspicious lesions noted Neurologic:    Mental status is normal. CN 2-11 are  normal. Gross motor and sensory exams are normal. Normal gait. No tremor Breast Exam: No mass, skin retraction or nipple discharge is appreciated in either breast. No axillary adenopathy. Fibrocystic changes are not noted Pelvic Exam: Normal external genitalia, no vulvar or vaginal lesions present. Mild white discharge present. Clear cervix w/o CMT. Bimanual exam reveals a nontender fundus w/o masses, nl size. No adnexal masses present. No inguinal adenopathy. A PAP smear was performed.   Commons side effects, risks, benefits, and alternatives for medications and treatment plan prescribed today were discussed, and the patient expressed understanding of the given instructions. Patient is instructed to call or message via MyChart if he/she has any questions or concerns regarding our treatment plan. No barriers to understanding were identified. We discussed Red Flag symptoms and signs in detail. Patient expressed understanding regarding what to do in case of urgent or emergency type symptoms.   Medication list was reconciled, printed and provided to the patient in AVS. Patient instructions and summary information was reviewed with the patient as documented in the AVS. This note was prepared with assistance of Dragon voice recognition software. Occasional wrong-word or sound-a-like substitutions may have occurred due to the inherent limitations of voice recognition software

## 2018-05-31 NOTE — Patient Instructions (Addendum)
Please return in 12 months for your annual complete physical; please come fasting. Schedule your appointment for your next Depo-provera injection in July. We will call you if you need to pick up the medication prior the appointment.   We will call you with information regarding your referral appointment. [facility]. If you do not hear from us within the next 2 weeks, please let me know. It can take 1-2 weeks to get appointments set up with the specialists.   If you have any questions or concerns, please don't hesitate to send me a message via MyChart or call the office at [phone]. Thank you for visiting with us today! It's our pleasure caring for you.  Please do these things to maintain good health!   Exercise at least 30-45 minutes a day,  4-5 days a week.   Eat a low-fat diet with lots of fruits and vegetables, up to 7-9 servings per day.  Drink plenty of water daily. Try to drink 8 8oz glasses per day.  Seatbelts can save your life. Always wear your seatbelt.  Place Smoke Detectors on every level of your home and check batteries every year.  Schedule an appointment with an eye doctor for an eye exam every 1-2 years  Safe sex - use condoms to protect yourself from STDs if you could be exposed to these types of infections. Use birth control if you do not want to become pregnant and are sexually active.  Avoid heavy alcohol use. If you drink, keep it to less than 2 drinks/day and not every day.  Health Care Power of Attorney.  Choose someone you trust that could speak for you if you became unable to speak for yourself.  Depression is common in our stressful world.If you're feeling down or losing interest in things you normally enjoy, please come in for a visit.  If anyone is threatening or hurting you, please get help. Physical or Emotional Violence is never OK.

## 2018-06-01 LAB — RPR: RPR Ser Ql: NONREACTIVE

## 2018-06-01 LAB — HIV ANTIBODY (ROUTINE TESTING W REFLEX): HIV 1&2 Ab, 4th Generation: NONREACTIVE

## 2018-06-01 LAB — CERVICOVAGINAL ANCILLARY ONLY
Chlamydia: NEGATIVE
Neisseria Gonorrhea: NEGATIVE

## 2018-06-02 LAB — CYTOLOGY - PAP
Diagnosis: NEGATIVE
HPV: NOT DETECTED

## 2018-06-03 ENCOUNTER — Encounter: Payer: Self-pay | Admitting: Family Medicine

## 2018-06-03 NOTE — Progress Notes (Signed)
Please call patient: I have reviewed his/her lab results. Please let her know that ALL lab testing and pap returned normal! Pap is now normal; all STD screening is negative. And all blood counts/levels are good. Just needs to get her mammogram and work on quitting smoking. thanks

## 2018-06-15 ENCOUNTER — Telehealth: Payer: Self-pay

## 2018-06-15 NOTE — Telephone Encounter (Signed)
Pt. Given results and instructions. Verbalizes understanding.States she will schedule her mammogram.

## 2018-06-20 ENCOUNTER — Ambulatory Visit (INDEPENDENT_AMBULATORY_CARE_PROVIDER_SITE_OTHER): Payer: 59 | Admitting: Emergency Medicine

## 2018-06-20 DIAGNOSIS — Z3042 Encounter for surveillance of injectable contraceptive: Secondary | ICD-10-CM

## 2018-06-20 MED ORDER — MEDROXYPROGESTERONE ACETATE 150 MG/ML IM SUSP
150.0000 mg | Freq: Once | INTRAMUSCULAR | Status: AC
Start: 2018-06-20 — End: 2018-06-20
  Administered 2018-06-20: 150 mg via INTRAMUSCULAR

## 2018-08-23 ENCOUNTER — Encounter: Payer: Self-pay | Admitting: Physician Assistant

## 2018-08-23 ENCOUNTER — Ambulatory Visit: Payer: 59 | Admitting: Physician Assistant

## 2018-08-23 ENCOUNTER — Other Ambulatory Visit: Payer: Self-pay

## 2018-08-23 VITALS — BP 98/58 | HR 84 | Temp 99.2°F | Resp 14 | Ht 64.0 in | Wt 109.0 lb

## 2018-08-23 DIAGNOSIS — R300 Dysuria: Secondary | ICD-10-CM

## 2018-08-23 LAB — POCT URINALYSIS DIPSTICK
Bilirubin, UA: NEGATIVE
Blood, UA: NEGATIVE
Glucose, UA: NEGATIVE
Ketones, UA: NEGATIVE
Leukocytes, UA: NEGATIVE
Nitrite, UA: NEGATIVE
Protein, UA: NEGATIVE
Spec Grav, UA: 1.01 (ref 1.010–1.025)
Urobilinogen, UA: 0.2 E.U./dL
pH, UA: 7.5 (ref 5.0–8.0)

## 2018-08-23 MED ORDER — CEPHALEXIN 500 MG PO CAPS: 500.0000 mg | ORAL_CAPSULE | Freq: Two times a day (BID) | ORAL | 0 refills | Status: DC

## 2018-08-23 NOTE — Patient Instructions (Addendum)
Please take antibiotic as directed. Increase fluids.  Tylenol if needed for pain. We are sending urine for culture and will call you once results are in. Return immediately or go to the ER if there is any worsening symptoms while on treatment.    Urinary Tract Infection, Adult A urinary tract infection (UTI) is an infection of any part of the urinary tract. The urinary tract includes the:  Kidneys.  Ureters.  Bladder.  Urethra.  These organs make, store, and get rid of pee (urine) in the body. Follow these instructions at home:  Take over-the-counter and prescription medicines only as told by your doctor.  If you were prescribed an antibiotic medicine, take it as told by your doctor. Do not stop taking the antibiotic even if you start to feel better.  Avoid the following drinks: ? Alcohol. ? Caffeine. ? Tea. ? Carbonated drinks.  Drink enough fluid to keep your pee clear or pale yellow.  Keep all follow-up visits as told by your doctor. This is important.  Make sure to: ? Empty your bladder often and completely. Do not to hold pee for long periods of time. ? Empty your bladder before and after sex. ? Wipe from front to back after a bowel movement if you are female. Use each tissue one time when you wipe. Contact a doctor if:  You have back pain.  You have a fever.  You feel sick to your stomach (nauseous).  You throw up (vomit).  Your symptoms do not get better after 3 days.  Your symptoms go away and then come back. Get help right away if:  You have very bad back pain.  You have very bad lower belly (abdominal) pain.  You are throwing up and cannot keep down any medicines or water. This information is not intended to replace advice given to you by your health care provider. Make sure you discuss any questions you have with your health care provider. Document Released: 05/11/2008 Document Revised: 04/30/2016 Document Reviewed: 10/14/2015 Elsevier  Interactive Patient Education  [employer]2018 Elsevier Inc.

## 2018-08-23 NOTE — Progress Notes (Signed)
ZOX:[id number]:[name], Kristine Bondsamille L, MD Chief Complaint  Patient presents with  . Urinary Tract Infection    Current Issues:  Presents with 2 days of dysuria, urinary frequency and fatigue and mild R LBP Associated symptoms include:  lower abdominal pain. Denies urinary hesitancy, hematuria, nausea or vomiting. Patient does have a history of pyelonephritis.  There is a previous history of of similar symptoms. Sexually active:  Yes with female.   No concern for STI  Prior to Admission medications   Medication Sig Start Date End Date Taking? Authorizing Provider  diclofenac (VOLTAREN) 75 MG EC tablet Take 1 tablet (75 mg total) by mouth 2 (two) times daily. 05/19/18  Yes [name], Camille L, MD   Review of Systems: Pertinent ROS are listed in the HPI  PE:  BP (!) 98/58   Pulse 84   Temp 99.2 F (37.3 C) (Oral)   Resp 14   Ht 5\' 4"  (1.626 m)   Wt 109 lb (49.4 kg)   SpO2 99%   BMI 18.71 kg/m   General appearance: alert, cooperative, appears stated age and no distress Lungs: clear to auscultation bilaterally Heart: regular rate and rhythm, S1, S2 normal, no murmur, click, rub or gallop Neurologic: Alert and oriented X 3, normal strength and tone. Normal symmetric reflexes. Normal coordination and gait  Results for orders placed or performed in visit on 08/23/18  POCT urinalysis dipstick  Result Value Ref Range   Color, UA yellow    Clarity, UA clear    Glucose, UA Negative Negative   Bilirubin, UA Neg    Ketones, UA Neg    Spec Grav, UA 1.010 1.010 - 1.025   Blood, UA Neg    pH, UA 7.5 5.0 - 8.0   Protein, UA Negative Negative   Urobilinogen, UA 0.2 0.2 or 1.0 E.U./dL   Nitrite, UA neg    Leukocytes, UA Negative Negative   Appearance     Odor      Assessment and Plan:  1. Dysuria Urine dip negative. Will classic symptoms and + history,will start treatment. Rx keflex 500 mg BID x 7 days. Will alter based on culture. Supportive measures and OTC medications reviewed. Needs to hydrate better!  ER for any worsening symptoms.  - POCT urinalysis dipstick

## 2018-08-25 ENCOUNTER — Telehealth: Payer: Self-pay | Admitting: Family Medicine

## 2018-08-25 LAB — URINE CULTURE
MICRO NUMBER:: 91115716
Result:: NO GROWTH
SPECIMEN QUALITY:: ADEQUATE

## 2018-08-25 IMAGING — CT CT ABD-PELV W/ CM
2 of 5 series · 17 of 46 positions shown, 19 images · IV contrast (ISOVUE)
Comparison: None.

CLINICAL DATA: Right lower quadrant pain for 2 weeks

EXAM:
CT ABDOMEN AND PELVIS WITH CONTRAST
TECHNIQUE: Multidetector CT imaging of the abdomen and pelvis was performed
using the standard protocol following bolus administration of
intravenous contrast.
CONTRAST:  100 mL Csovue-6UU intravenous

[Series 2: abd/pel with · axial · 0.68mm/px · z∈[-426,-76]mm · 14 of 81 slices shown, 16 images]
[im 6/81  soft-tissue]
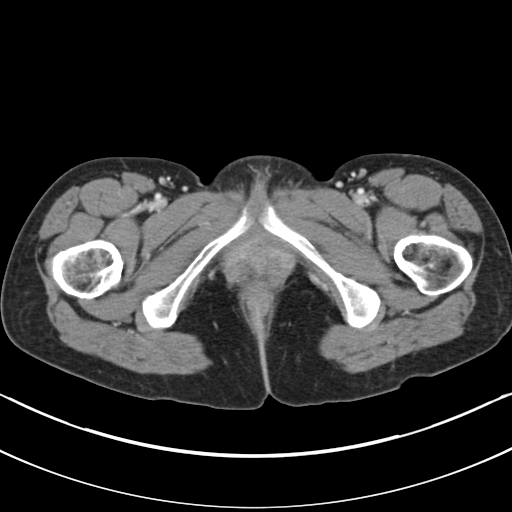
[im 6/81  bone]
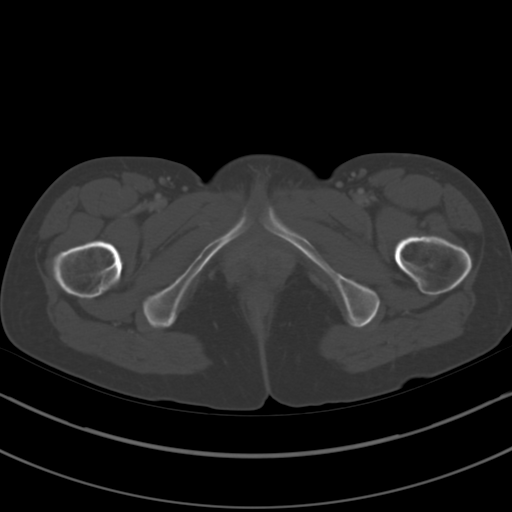
[im 11/81  soft-tissue]
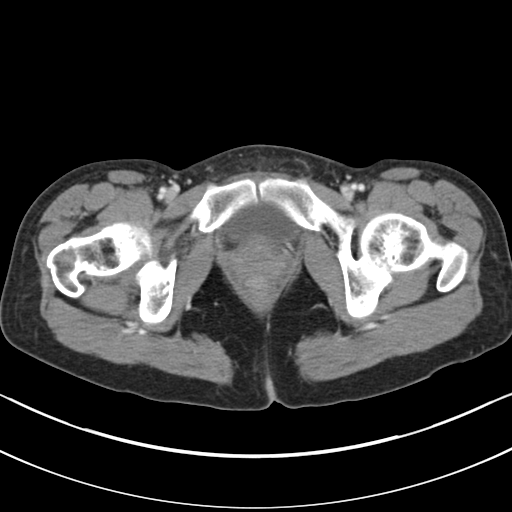
[im 16/81  soft-tissue]
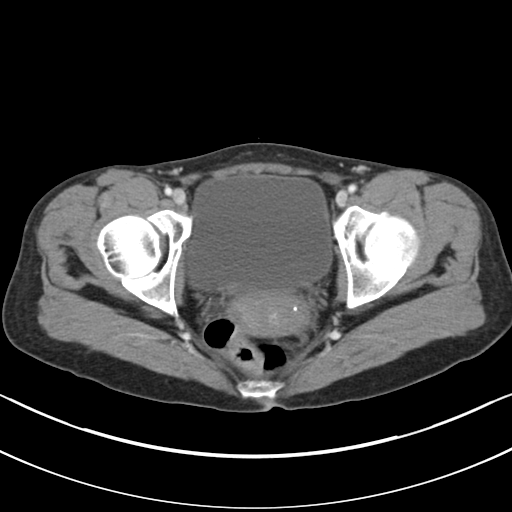
[im 21/81  soft-tissue]
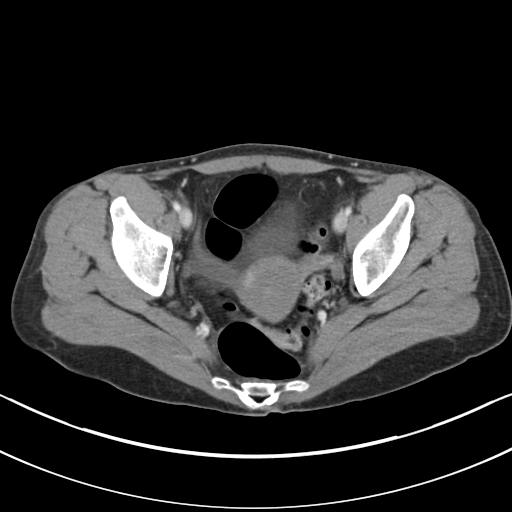
[im 26/81  soft-tissue]
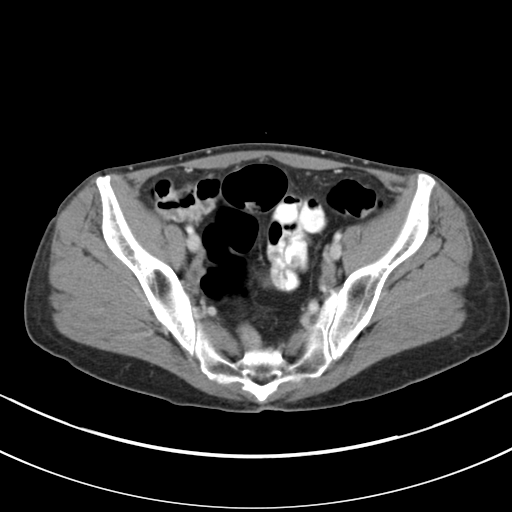
[im 31/81  soft-tissue]
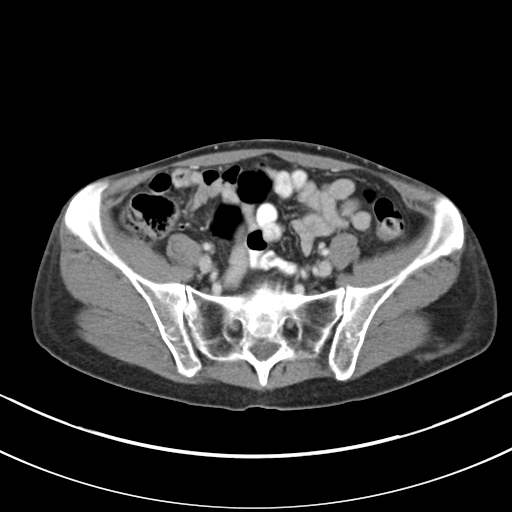
[im 36/81  soft-tissue]
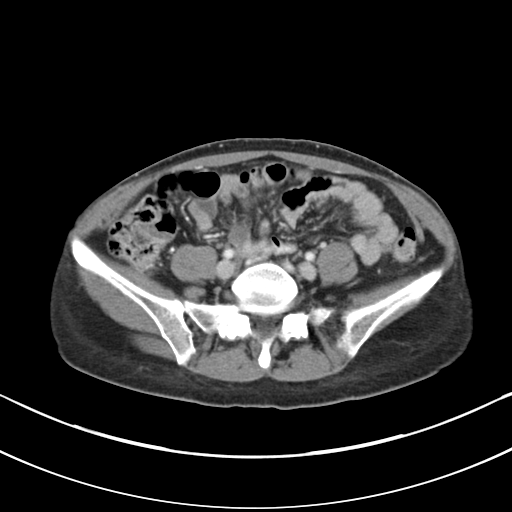
[im 46/81  soft-tissue]
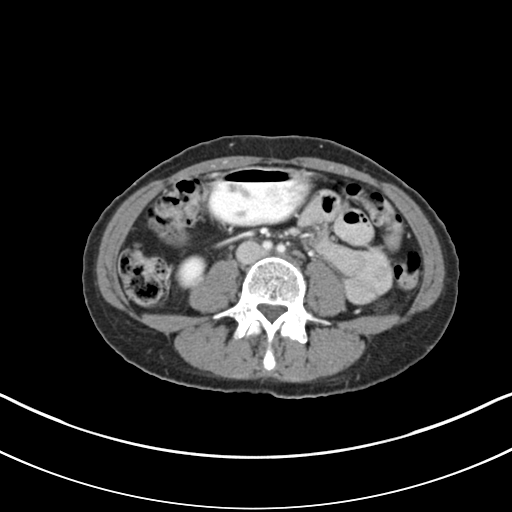
[im 51/81  soft-tissue]
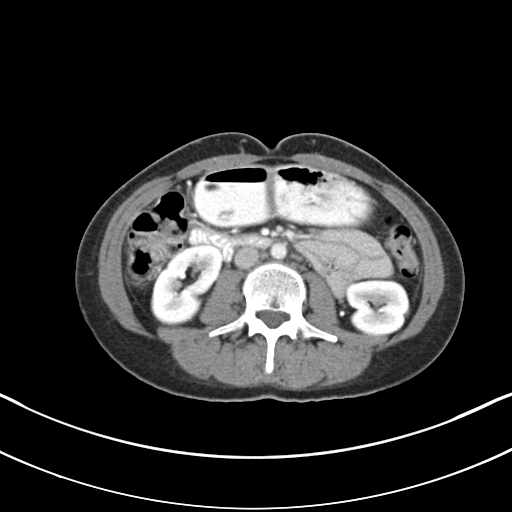
[im 51/81  bone]
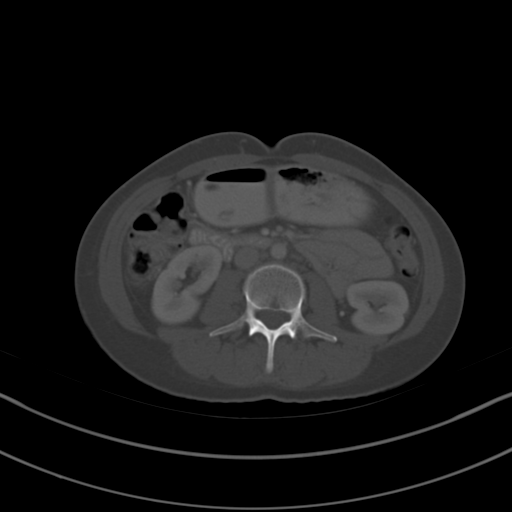
[im 56/81  soft-tissue]
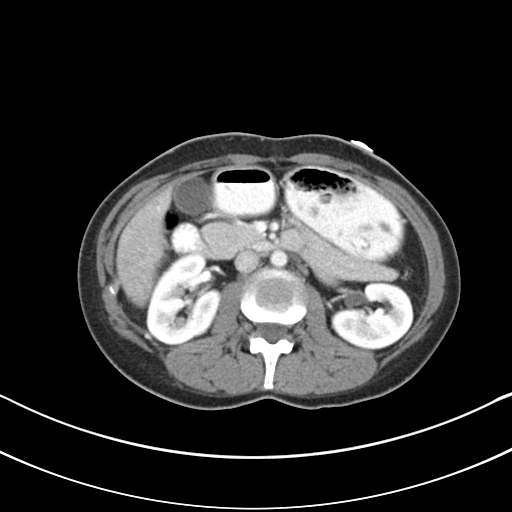
[im 61/81  soft-tissue]
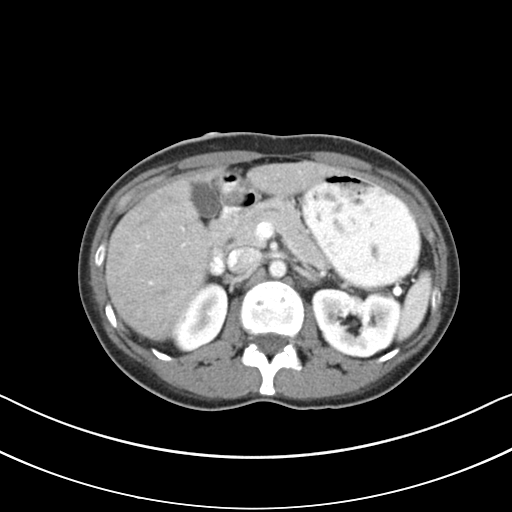
[im 66/81  soft-tissue]
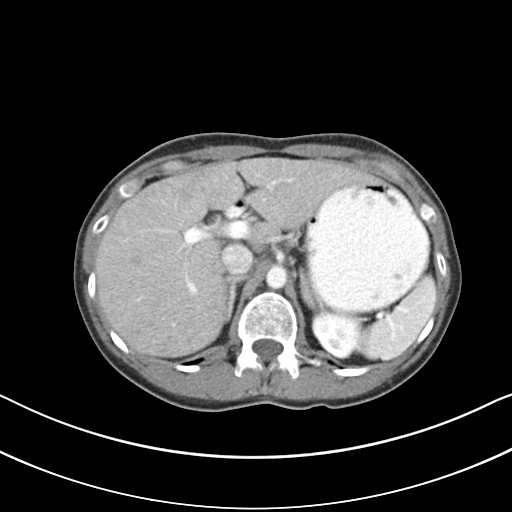
[im 71/81  soft-tissue]
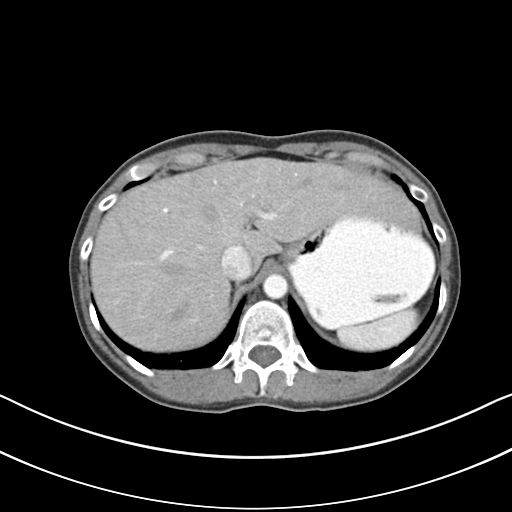
[im 76/81  soft-tissue]
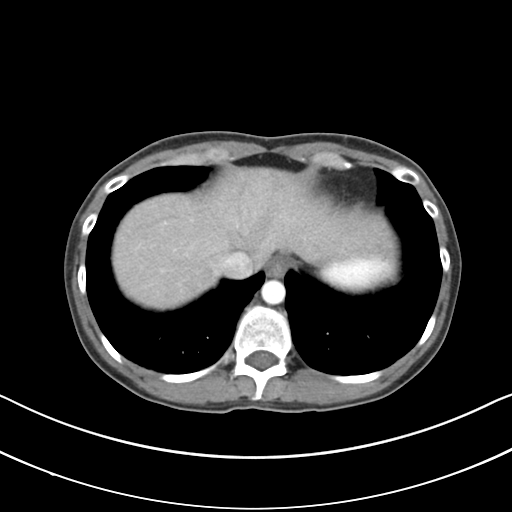

[Series 5: coronal a/|p · coronal · 0.69mm/px · 3 of 97 slices shown]
[im 33/97  soft-tissue]
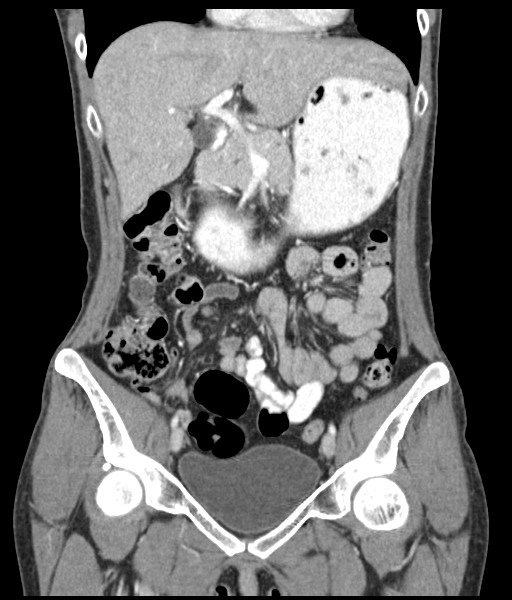
[im 43/97  soft-tissue]
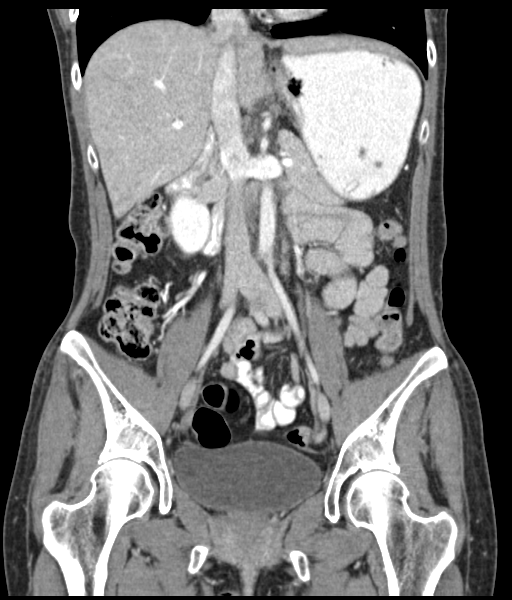
[im 54/97  soft-tissue]
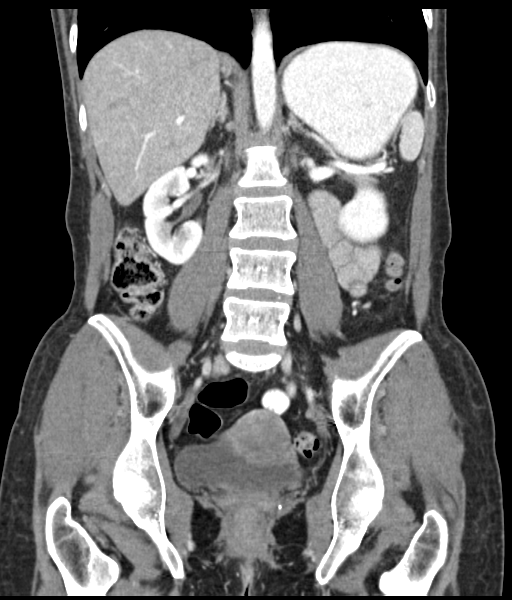

[17 of 46 positions shown; findings below may reference images not displayed]

FINDINGS: Lower chest: No acute abnormality.

Hepatobiliary: No focal liver abnormality is seen. No gallstones,
gallbladder wall thickening, or biliary dilatation.

Pancreas: Unremarkable. No pancreatic ductal dilatation or
surrounding inflammatory changes.

Spleen: Normal in size without focal abnormality.

Adrenals/Urinary Tract: Adrenal glands are unremarkable. Kidneys are
normal, without renal calculi, focal lesion, or hydronephrosis.
Bladder is unremarkable.

Stomach/Bowel: Stomach is within normal limits. Appendix appears
normal. No evidence of bowel wall thickening, distention, or
inflammatory changes.

Vascular/Lymphatic: No significant vascular findings are present. No
enlarged abdominal or pelvic lymph nodes.

Reproductive: Uterus and bilateral adnexa are unremarkable.

Other: Negative for free air or free fluid. Tiny fat in the
umbilicus.

Musculoskeletal: No acute or significant osseous findings.
IMPRESSION: No CT evidence for acute intra-abdominal or pelvic abnormality.
Negative appendix.

## 2018-08-25 NOTE — Telephone Encounter (Signed)
See result note. Please requesting an afternoon appt for tomorrow because of her job. Appointment scheduled for 1 pm. But she would like a 3:30 or 4 pm. She is asking for a call back in the morning.

## 2018-08-26 ENCOUNTER — Encounter: Payer: Self-pay | Admitting: Emergency Medicine

## 2018-08-26 ENCOUNTER — Ambulatory Visit: Payer: 59 | Admitting: Family Medicine

## 2018-08-26 ENCOUNTER — Other Ambulatory Visit: Payer: Self-pay

## 2018-08-26 ENCOUNTER — Encounter: Payer: Self-pay | Admitting: Family Medicine

## 2018-08-26 ENCOUNTER — Ambulatory Visit (INDEPENDENT_AMBULATORY_CARE_PROVIDER_SITE_OTHER): Payer: 59

## 2018-08-26 ENCOUNTER — Ambulatory Visit (INDEPENDENT_AMBULATORY_CARE_PROVIDER_SITE_OTHER): Payer: 59 | Admitting: Family Medicine

## 2018-08-26 VITALS — BP 112/60 | HR 94 | Temp 98.1°F | Ht 64.0 in | Wt 108.2 lb

## 2018-08-26 DIAGNOSIS — R109 Unspecified abdominal pain: Secondary | ICD-10-CM | POA: Diagnosis not present

## 2018-08-26 DIAGNOSIS — R3915 Urgency of urination: Secondary | ICD-10-CM

## 2018-08-26 DIAGNOSIS — R0781 Pleurodynia: Secondary | ICD-10-CM

## 2018-08-26 DIAGNOSIS — F17210 Nicotine dependence, cigarettes, uncomplicated: Secondary | ICD-10-CM | POA: Diagnosis not present

## 2018-08-26 DIAGNOSIS — B9689 Other specified bacterial agents as the cause of diseases classified elsewhere: Secondary | ICD-10-CM

## 2018-08-26 DIAGNOSIS — J208 Acute bronchitis due to other specified organisms: Secondary | ICD-10-CM

## 2018-08-26 LAB — CBC WITH DIFFERENTIAL/PLATELET
Basophils Absolute: 0.1 10*3/uL (ref 0.0–0.1)
Basophils Relative: 0.7 % (ref 0.0–3.0)
Eosinophils Absolute: 0.2 10*3/uL (ref 0.0–0.7)
Eosinophils Relative: 1.9 % (ref 0.0–5.0)
HCT: 40.7 % (ref 36.0–46.0)
Hemoglobin: 13.4 g/dL (ref 12.0–15.0)
Lymphocytes Relative: 20.5 % (ref 12.0–46.0)
Lymphs Abs: 2.1 10*3/uL (ref 0.7–4.0)
MCHC: 33 g/dL (ref 30.0–36.0)
MCV: 90.8 fl (ref 78.0–100.0)
Monocytes Absolute: 0.6 10*3/uL (ref 0.1–1.0)
Monocytes Relative: 6 % (ref 3.0–12.0)
Neutro Abs: 7.1 10*3/uL (ref 1.4–7.7)
Neutrophils Relative %: 70.9 % (ref 43.0–77.0)
Platelets: 347 10*3/uL (ref 150.0–400.0)
RBC: 4.48 Mil/uL (ref 3.87–5.11)
RDW: 14.1 % (ref 11.5–15.5)
WBC: 10.1 10*3/uL (ref 4.0–10.5)

## 2018-08-26 LAB — COMPREHENSIVE METABOLIC PANEL
ALT: 9 U/L (ref 0–35)
AST: 13 U/L (ref 0–37)
Albumin: 4.4 g/dL (ref 3.5–5.2)
Alkaline Phosphatase: 52 U/L (ref 39–117)
BUN: 8 mg/dL (ref 6–23)
CO2: 26 mEq/L (ref 19–32)
Calcium: 9.6 mg/dL (ref 8.4–10.5)
Chloride: 108 mEq/L (ref 96–112)
Creatinine, Ser: 0.8 mg/dL (ref 0.40–1.20)
GFR: 83.24 mL/min (ref >60.00)
Glucose, Bld: 61 mg/dL — ABNORMAL LOW (ref 70–99)
Potassium: 4.4 mEq/L (ref 3.5–5.1)
Sodium: 141 mEq/L (ref 135–145)
Total Bilirubin: 0.3 mg/dL (ref 0.2–1.2)
Total Protein: 6.8 g/dL (ref 6.0–8.3)

## 2018-08-26 LAB — POCT URINALYSIS DIPSTICK
Bilirubin, UA: NEGATIVE
Blood, UA: NEGATIVE
Glucose, UA: NEGATIVE
Ketones, UA: NEGATIVE
Leukocytes, UA: NEGATIVE
Nitrite, UA: NEGATIVE
Protein, UA: NEGATIVE
Spec Grav, UA: 1.015 (ref 1.010–1.025)
Urobilinogen, UA: 0.2 E.U./dL
pH, UA: 6 (ref 5.0–8.0)

## 2018-08-26 NOTE — Telephone Encounter (Signed)
Result note was routed to provider team

## 2018-08-26 NOTE — Patient Instructions (Addendum)
Please follow up if symptoms do not improve or as needed.   Quit smoking. Start the 21mcg daily patch.  Get rid of your cigarettes and smoking stuff.  Write out a plan of ten steps you will do to change your habits: drive home a different way to avoid where you purchase your cigarettes, new morning routine to avoid am cigarette, hand to mouth replacements, etc. ...  Go get your chest xray right now.  Please go to our [facility] [place] office to get your xrays done. You can walk in M-F between 8am and 5pm. Tell them you are there for xrays ordered by me. They will send me the results, then I will let you know the results with instructions.   Address: [address], [place], [place] [phone]  (office sits at [address] at [address]; from here, turn left onto [address] to [address], turn right and go for a mile or so, office will be on left across form [address] )

## 2018-08-26 NOTE — Progress Notes (Signed)
Subjective  CC:  Chief Complaint  Patient presents with  . Flank Pain    right sided flank pain x 4 days, urinary frequency and low grade fever  . Cough    pain in chest with coughing     HPI: Kristine Holder is a 42 y.o. female who presents to the office today to address the problems listed above in the chief complaint.  [name] returns because she "does not feel well".  She was here a few days ago for right-sided low back pain with urinary frequency.  Urine testing and culture was negative.  She is been on Keflex for about 24 hours.  She now reports malaise, body aches, pleuritic chest pain and nonproductive cough.  She denies wheezing or shortness of breath.  She has had a low-grade fever 2 days ago.  Decreased appetite but no nausea, vomiting, diarrhea, or abdominal pain.  Her back pain is right sided.  No lower extremity pain.  No calf pain or swelling.  She is a chronic smoker.  She is been smoking less while she is sick and she would consider quitting.  She has failed multiple times in the past.  Assessment  1. Pleuritic chest pain   2. Flank pain   3. Urinary urgency   4. Cigarette nicotine dependence without complication   5. Acute bacterial bronchitis      Plan   Pleuritic chest pain in a smoker with probable bronchitis: Recommend lab work, chest x-ray, quitting smoking, continuing antibiotics, hydration, and rest.  Reassured, no signs or symptoms of pyelonephritis.  I believe back pain is musculoskeletal.  Differential does include PE or pleurisy or pericarditis.  Recommend NSAIDs for pain.  Follow-up if unimproved.  Discussed smoking cessation at length: Trial of nicotine patches and behavioral change.  Follow up: Return if symptoms worsen or fail to improve.   Orders Placed This Encounter  Procedures  . DG Chest 2 View  . CBC with Differential/Platelet  . Comprehensive metabolic panel  . D-dimer, quantitative (not at [facility])  . POCT urinalysis dipstick   No orders  of the defined types were placed in this encounter.     I reviewed the patients updated PMH, FH, and SocHx.    Patient Active Problem List   Diagnosis Date Noted  . Dependence on nicotine from cigarettes 05/17/2018  . History of abnormal cervical Pap smear 05/17/2018  . Irritable bowel syndrome (IBS) 05/17/2018  . Encounter for Depo-Provera contraception 01/13/2018  . Allergic rhinitis 04/02/2014   Current Meds  Medication Sig  . cephALEXin (KEFLEX) 500 MG capsule Take 1 capsule (500 mg total) by mouth 2 (two) times daily.  . diclofenac (VOLTAREN) 75 MG EC tablet Take 75 mg by mouth as needed.  . [DISCONTINUED] diclofenac (VOLTAREN) 75 MG EC tablet Take 1 tablet (75 mg total) by mouth 2 (two) times daily.    Allergies: Patient is allergic to prednisone. Family History: Patient family history includes Alcohol abuse in her maternal grandfather and paternal grandfather; COPD in her paternal grandmother; Healthy in her son; Hypertension in her father; Kidney disease in her mother; Learning disabilities in her mother. Social History:  Patient  reports that she has been smoking cigarettes. She has a 24.00 pack-year smoking history. She has never used smokeless tobacco. She reports that she drinks alcohol. She reports that she does not use drugs.  Review of Systems: Constitutional: Positive for fever malaise or anorexia Cardiovascular: Positive for chest pain Respiratory: negative for SOB or  productive cough Gastrointestinal: negative for abdominal pain  Objective  Vitals: BP 112/60   Pulse 94   Temp 98.1 F (36.7 C)   Ht 5\' 4"  (1.626 m)   Wt 108 lb 3.2 oz (49.1 kg)   SpO2 96%   BMI 18.57 kg/m  General: no acute distress , A&Ox3, no respiratory distress, nontoxic-appearing, HEENT: PEERL, conjunctiva normal, Oropharynx moist,neck is supple Cardiovascular:  RRR without murmur or gallop.  No rub Respiratory:  Good breath sounds bilaterally, CTAB with normal respiratory effort, no  wheezing Gastrointestinal: soft, flat abdomen, normal active bowel sounds, no palpable masses, no hepatosplenomegaly, no appreciated hernias No flank tenderness, no suprapubic tenderness Skin:  Warm, no rashes Musculoskeletal: No calf swelling or calf tenderness  Office Visit on 08/26/2018  Component Date Value Ref Range Status  . Color, UA 08/26/2018 Yellow   Final  . Clarity, UA 08/26/2018 Clear   Final  . Glucose, UA 08/26/2018 Negative  Negative Final  . Bilirubin, UA 08/26/2018 Negative   Final  . Ketones, UA 08/26/2018 Negative   Final  . Spec Grav, UA 08/26/2018 1.015  1.010 - 1.025 Final  . Blood, UA 08/26/2018 Negative   Final  . pH, UA 08/26/2018 6.0  5.0 - 8.0 Final  . Protein, UA 08/26/2018 Negative  Negative Final  . Urobilinogen, UA 08/26/2018 0.2  0.2 or 1.0 E.U./dL Final  . Nitrite, UA 16/09/9603 Neg   Final  . Leukocytes, UA 08/26/2018 Negative  Negative Final      Commons side effects, risks, benefits, and alternatives for medications and treatment plan prescribed today were discussed, and the patient expressed understanding of the given instructions. Patient is instructed to call or message via MyChart if he/she has any questions or concerns regarding our treatment plan. No barriers to understanding were identified. We discussed Red Flag symptoms and signs in detail. Patient expressed understanding regarding what to do in case of urgent or emergency type symptoms.   Medication list was reconciled, printed and provided to the patient in AVS. Patient instructions and summary information was reviewed with the patient as documented in the AVS. This note was prepared with assistance of Dragon voice recognition software. Occasional wrong-word or sound-a-like substitutions may have occurred due to the inherent limitations of voice recognition software

## 2018-08-27 LAB — D-DIMER, QUANTITATIVE (NOT AT ARMC): D-Dimer, Quant: 0.26 mcg/mL FEU (ref <0.50)

## 2018-08-29 NOTE — Progress Notes (Signed)
Please call patient: I have reviewed his/her lab results. All blood testing and chest xray results are normal. Hopefully she is feeling better now. Please encourage her to quit smoking as smoker's bronchitis is likely to recur again. thanks

## 2018-08-29 NOTE — Progress Notes (Signed)
Normal chest x-ray.

## 2018-09-20 ENCOUNTER — Ambulatory Visit: Payer: 59 | Admitting: *Deleted

## 2018-09-20 DIAGNOSIS — Z3042 Encounter for surveillance of injectable contraceptive: Secondary | ICD-10-CM

## 2018-09-20 MED ORDER — MEDROXYPROGESTERONE ACETATE 150 MG/ML IM SUSP: 150.0000 mg | Freq: Once | INTRAMUSCULAR | Status: DC

## 2018-09-20 NOTE — Progress Notes (Signed)
Patient in for Depo Provera  injection.     Injection was administered in the lab by Adela Glimpse, CMA  Patient tolerated well.  Due for next injection around December 21, 2018.

## 2018-10-03 ENCOUNTER — Encounter: Payer: Self-pay | Admitting: Family Medicine

## 2018-10-03 ENCOUNTER — Ambulatory Visit: Payer: 59 | Admitting: Family Medicine

## 2018-10-03 VITALS — BP 100/58 | HR 61 | Temp 98.7°F | Resp 14 | Ht 64.0 in | Wt 111.0 lb

## 2018-10-03 DIAGNOSIS — F410 Panic disorder [episodic paroxysmal anxiety] without agoraphobia: Secondary | ICD-10-CM | POA: Diagnosis not present

## 2018-10-03 DIAGNOSIS — Z716 Tobacco abuse counseling: Secondary | ICD-10-CM | POA: Diagnosis not present

## 2018-10-03 DIAGNOSIS — F17209 Nicotine dependence, unspecified, with unspecified nicotine-induced disorders: Secondary | ICD-10-CM

## 2018-10-03 DIAGNOSIS — Z23 Encounter for immunization: Secondary | ICD-10-CM | POA: Diagnosis not present

## 2018-10-03 MED ORDER — ESCITALOPRAM OXALATE 10 MG PO TABS: 10.0000 mg | ORAL_TABLET | Freq: Every day | ORAL | 2 refills | Status: DC

## 2018-10-03 NOTE — Progress Notes (Signed)
Subjective  CC:  Chief Complaint  Patient presents with  . Anxiety    going on for several months but becoming more often    HPI: Kristine Holder is a 42 y.o. female who presents to the office today to address the problems listed above in the chief complaint. [name] reports due to progressive and worsening anxiety symptoms.  She describes classic panic symptoms including acute onset palpitations, feeling of impending doom, lightheadedness, peripheral paresthesias and weakness in the legs.  Symptoms resolved after minutes in sitting down.  She is unsure of triggers.  She admits to recent increase in stressors.  Her 70 year old son is a father now.  This is caused tension and already strenuous relationship with her ex.  She admits to problems sleeping.  Continues to have a stressful job.  She admits to mild symptoms of depression. In the past, she has been treated with Wellbutrin once for anxiety.  She took that for about a year.  It did help with anxiety symptoms but she did become at the apathetic. She currently smokes as a management strategy for her stress.  She does not feel that should be successful in trying to quit at this time. Depression screen [facility] 2/9 10/03/2018 05/17/2018  Decreased Interest 3 0  Down, Depressed, Hopeless 3 0  PHQ - 2 Score 6 0  Altered sleeping 1 0  Tired, decreased energy 1 0  Change in appetite 1 0  Feeling bad or failure about yourself  1 0  Trouble concentrating 3 0  Moving slowly or fidgety/restless 0 0  Suicidal thoughts 0 0  PHQ-9 Score 13 0  Difficult doing work/chores Somewhat difficult Not difficult at all     Assessment  1. Panic disorder   2. Tobacco use disorder, continuous   3. Encounter for smoking cessation counseling   4. Need for immunization against influenza      Plan   Panic disorder: Possibly stress reaction but could be generalized anxiety disorder with mild depression as well.  Recommend starting with an SSRI, Lexapro 10 mg  daily.  Educated on appropriate expectations and use.  Close follow-up.  Recommended deep breathing exercises for panic attacks.  Also recommend counseling to help manage her family stressors.  Patient will call to make an appointment.  Follow up: Return in about 6 weeks (around 11/14/2018) for mood follow up.   Orders Placed This Encounter  Procedures  . Flu Vaccine QUAD 36+ mos IM   Meds ordered this encounter  Medications  . escitalopram (LEXAPRO) 10 MG tablet    Sig: Take 1 tablet (10 mg total) by mouth daily.    Dispense:  30 tablet    Refill:  2      I reviewed the patients updated PMH, FH, and SocHx.    Patient Active Problem List   Diagnosis Date Noted  . Panic disorder 10/03/2018  . Dependence on nicotine from cigarettes 05/17/2018  . History of abnormal cervical Pap smear 05/17/2018  . Irritable bowel syndrome (IBS) 05/17/2018  . Encounter for Depo-Provera contraception 01/13/2018  . Allergic rhinitis 04/02/2014   Current Meds  Medication Sig  . diclofenac (VOLTAREN) 75 MG EC tablet Take 75 mg by mouth as needed.  . [DISCONTINUED] cephALEXin (KEFLEX) 500 MG capsule Take 1 capsule (500 mg total) by mouth 2 (two) times daily.   Current Facility-Administered Medications for the 10/03/18 encounter (Office Visit) with Willow Ora, MD  Medication  . medroxyPROGESTERone (DEPO-PROVERA) injection 150 mg  Allergies: Patient is allergic to prednisone. Family History: Patient family history includes Alcohol abuse in her maternal grandfather and paternal grandfather; COPD in her paternal grandmother; Healthy in her son; Hypertension in her father; Kidney disease in her mother; Learning disabilities in her mother. Social History:  Patient  reports that she has been smoking cigarettes. She has a 24.00 pack-year smoking history. She has never used smokeless tobacco. She reports that she drinks alcohol. She reports that she does not use drugs.  Review of  Systems: Constitutional: Negative for fever malaise or anorexia Cardiovascular: negative for chest pain Respiratory: negative for SOB or persistent cough Gastrointestinal: negative for abdominal pain  Objective  Vitals: BP (!) 100/58   Pulse 61   Temp 98.7 F (37.1 C) (Oral)   Resp 14   Ht 5\' 4"  (1.626 m)   Wt 111 lb (50.3 kg)   SpO2 98%   BMI 19.05 kg/m   General: no acute distress , A&Ox3 Psych, appears anxious.  Good insight. HEENT: PEERL, conjunctiva normal, Oropharynx moist,neck is supple Cardiovascular:  RRR without murmur or gallop.  Respiratory:  Good breath sounds bilaterally, CTAB with normal respiratory effort Skin:  Warm, no rashes     Commons side effects, risks, benefits, and alternatives for medications and treatment plan prescribed today were discussed, and the patient expressed understanding of the given instructions. Patient is instructed to call or message via MyChart if he/she has any questions or concerns regarding our treatment plan. No barriers to understanding were identified. We discussed Red Flag symptoms and signs in detail. Patient expressed understanding regarding what to do in case of urgent or emergency type symptoms.   Medication list was reconciled, printed and provided to the patient in AVS. Patient instructions and summary information was reviewed with the patient as documented in the AVS. This note was prepared with assistance of Dragon voice recognition software. Occasional wrong-word or sound-a-like substitutions may have occurred due to the inherent limitations of voice recognition software

## 2018-10-03 NOTE — Patient Instructions (Addendum)
Please return in 4-6 weeks for recheck.  Start the new medication daily.  Go see a counselor to help manage your symptoms.  [facility].   If you have any questions or concerns, please don't hesitate to send me a message via MyChart or call the office at [phone]. Thank you for visiting with [place] today! It's our pleasure caring for you.  Panic Attack A panic attack is a sudden episode of severe anxiety, fear, or discomfort that causes physical and emotional symptoms. The attack may be in response to something frightening, or it may occur for no known reason. Symptoms of a panic attack can be similar to symptoms of a heart attack or stroke. It is important to see your health care provider when you have a panic attack so that these conditions can be ruled out. A panic attack is a symptom of another condition. Most panic attacks go away with treatment of the underlying problem. If you have panic attacks often, you may have a condition called panic disorder. What are the causes? A panic attack may be caused by:  An extreme, life-threatening situation, such as a war or natural disaster.  An anxiety disorder, such as post-traumatic stress disorder.  Depression.  Certain medical conditions, including heart problems, neurological conditions, and infections.  Certain over-the-counter and prescription medicines.  Illegal drugs that increase heart rate and blood pressure, such as methamphetamine.  Alcohol.  Supplements that increase anxiety.  Panic disorder.  What increases the risk? You are more likely to develop this condition if:  You have an anxiety disorder.  You have another mental health condition.  You take certain medicines.  You use alcohol, illegal drugs, or other substances.  You are under extreme stress.  A life event is causing increased feelings of anxiety and depression.  What are the signs or symptoms? A panic attack starts suddenly, usually lasts  about 20 minutes, and occurs with one or more of the following:  A pounding heart.  A feeling that your heart is beating irregularly or faster than normal (palpitations).  Sweating.  Trembling or shaking.  Shortness of breath or feeling smothered.  Feeling choked.  Chest pain or discomfort.  Nausea or a strange feeling in your stomach.  Dizziness, feeling lightheaded, or feeling like you might faint.  Chills or hot flashes.  Numbness or tingling in your lips, hands, or feet.  Feeling confused, or feeling that you are not yourself.  Fear of losing control or being emotionally unstable.  Fear of dying.  How is this diagnosed? A panic attack is diagnosed with an assessment by your health care provider. During the assessment your health care provider will ask questions about:  Your history of anxiety, depression, and panic attacks.  Your medical history.  Whether you drink alcohol, use illegal drugs, take supplements, or take medicines. Be honest about your substance use.  Your health care provider may also:  Order blood tests or other kinds of tests to rule out serious medical conditions.  Refer you to a mental health professional for further evaluation.  How is this treated? Treatment depends on the cause of the panic attack:  If the cause is a medical problem, your health care provider will either treat that problem or refer you to a specialist.  If the cause is emotional, you may be given anti-anxiety medicines or referred to a counselor. These medicines may reduce how often attacks happen, reduce how severe the attacks are, and lower anxiety.  If  the cause is a medicine, your health care provider may tell you to stop the medicine, change your dose, or take a different medicine.  If the cause is a drug, treatment may involve letting the drug wear off and taking medicine to help the drug leave your body or to counteract its effects. Attacks caused by drug abuse  may continue even if you stop using the drug.  Follow these instructions at home:  Take over-the-counter and prescription medicines only as told by your health care provider.  If you feel anxious, limit your caffeine intake.  Take good care of your physical and mental health by: ? Eating a balanced diet that includes plenty of fresh fruits and vegetables, whole grains, lean meats, and low-fat dairy. ? Getting plenty of rest. Try to get 7-8 hours of uninterrupted sleep each night. ? Exercising regularly. Try to get 30 minutes of physical activity at least 5 days a week. ? Not smoking. Talk to your health care provider if you need help quitting. ? Limiting alcohol intake to no more than 1 drink a day for nonpregnant women and 2 drinks a day for men. One drink equals 12 oz of beer, 5 oz of wine, or 1 oz of hard liquor.  Keep all follow-up visits as told by your health care provider. This is important. Panic attacks may have underlying physical or emotional problems that take time to accurately diagnose. Contact a health care provider if:  Your symptoms do not improve, or they get worse.  You are not able to take your medicine as prescribed because of side effects. Get help right away if:  You have serious thoughts about hurting yourself or others.  You have symptoms of a panic attack. Do not drive yourself to the hospital. Have someone else drive you or call an ambulance. If you ever feel like you may hurt yourself or others, or you have thoughts about taking your own life, get help right away. You can go to your nearest emergency department or call:  Your local emergency services (911 in the [place]  A suicide crisis helpline, such as the National Suicide Prevention Lifeline at (445) 289-0859. This is open 24 hours a day.  Summary  A panic attack is a sign of a serious health or mental health condition. Get help right away. Do not drive yourself to the hospital. Have someone else  drive you or call an ambulance.  Always see a health care provider to have the reasons for the panic attack correctly diagnosed.  If your panic attack was caused by a physical problem, follow your health care provider's suggestions for medicine, referral to a specialist, and lifestyle changes.  If your panic attack was caused by an emotional problem, follow through with counseling from a qualified mental health specialist.  If you feel like you may hurt yourself or others, call 911 and get help right away. This information is not intended to replace advice given to you by your health care provider. Make sure you discuss any questions you have with your health care provider. Document Released: 11/23/2005 Document Revised: 01/01/2017 Document Reviewed: 01/01/2017 Elsevier Interactive Patient Education  [employer].

## 2018-10-04 ENCOUNTER — Ambulatory Visit: Payer: Self-pay

## 2018-10-04 NOTE — Telephone Encounter (Signed)
Let stomach settle.  Then rechallenge with a meal.

## 2018-10-04 NOTE — Telephone Encounter (Signed)
Phone call returned to pt.  Stated she started a new medication, Lexapro this morning.  Took 1st dose of Lexapro at approx. 11:00 AM, and 30 minutes later, became nauseated.  Reported she felt fine prior to taking the Lexapro.  Stated the nausea is severe.  Has vomited x 1, after trying to eat applesauce. Also reported she drank coffee today, and sips of Coke.  Denied abdominal pain or fever/chills.  C/o slight headache.  Reported she has had 3 loose bowel movements, since starting the Lexapro.  Reported she does not think she can tolerate the Lexapro, with these side effects.  Advised on following a clear liquid diet, and to hydrate with caffeine-free beverages, and to slowly introduce solids, such as saltine crackers, toast, rice, mashed potatoes, etc, as her stomach feels better.  Verb. Understanding.      Advised will make Dr. Mardelle Matte aware of her symptoms after taking the 1st dose of Lexapro, and to expect a call back from a nurse with further recommendations.      Reason for Disposition . Taking prescription medication that could cause nausea (e.g., narcotics/opiates, antibiotics, OCPs, many others)  Answer Assessment - Initial Assessment Questions 1. NAUSEA SEVERITY: "How bad is the nausea?" (e.g., mild, moderate, severe; dehydration, weight loss)   - MILD: loss of appetite without change in eating habits   - MODERATE: decreased oral intake without significant weight loss, dehydration, or malnutrition   - SEVERE: inadequate caloric or fluid intake, significant weight loss, symptoms of dehydration     Severe 2. ONSET: "When did the nausea begin?"     11:30 AM 3. VOMITING: "Any vomiting?" If so, ask: "How many times today?"    Vomited x one  4. RECURRENT SYMPTOM: "Have you had nausea before?" If so, ask: "When was the last time?" "What happened that time?"     Has not taken antidepressants for a couple of years; not sure it this happened with prior medications  5. CAUSE: "What do you think is  causing the nausea?"     Thinks it is the Lexapro 6. PREGNANCY: "Is there any chance you are pregnant?" (e.g., unprotected intercourse, missed birth control pill, broken condom)    On Depo injections; does not have menstrual cycle  Protocols used: NAUSEA-A-AH  Message from [employer] sent at 10/04/2018 1:11 PM EDT   Summary: medication side effect.   Pt states she was prescribed escitalopram (LEXAPRO) 10 MG tablet yesterday. Pt states that she picked it up and has taken one and she feels sick. Pt states that she didn't take it with anything to eat and only one sip of Coke with it. Pt wants to know if this is a normal side effect, if so she states she doesn't think she can continue to take it.

## 2018-10-04 NOTE — Telephone Encounter (Signed)
Patient informed and will call back if symptoms worsen.   Kathi Simpers,  LPN

## 2018-10-18 DIAGNOSIS — H109 Unspecified conjunctivitis: Secondary | ICD-10-CM | POA: Diagnosis not present

## 2018-11-07 ENCOUNTER — Other Ambulatory Visit: Payer: Self-pay

## 2018-11-07 ENCOUNTER — Encounter: Payer: Self-pay | Admitting: Family Medicine

## 2018-11-07 ENCOUNTER — Ambulatory Visit (INDEPENDENT_AMBULATORY_CARE_PROVIDER_SITE_OTHER): Payer: 59 | Admitting: Family Medicine

## 2018-11-07 VITALS — BP 118/68 | HR 75 | Temp 98.3°F | Ht 64.0 in | Wt 106.4 lb

## 2018-11-07 DIAGNOSIS — F410 Panic disorder [episodic paroxysmal anxiety] without agoraphobia: Secondary | ICD-10-CM

## 2018-11-07 MED ORDER — DICLOFENAC SODIUM 75 MG PO TBEC: 75.0000 mg | DELAYED_RELEASE_TABLET | ORAL | 1 refills | Status: DC | PRN

## 2018-11-07 MED ORDER — HYDROXYZINE HCL 25 MG PO TABS: 25.0000 mg | ORAL_TABLET | Freq: Every evening | ORAL | 5 refills | Status: DC | PRN

## 2018-11-07 NOTE — Progress Notes (Signed)
Subjective  CC:  Chief Complaint  Patient presents with  . Anxiety    doing well on medications, can tell she has more energy     HPI: Kristine Holder is a 42 y.o. female who presents to the office today to address the problems listed above in the chief complaint, mood problems.  42 year old here for follow-up of panic disorder and anxiety.  We started Lexapro about 6 weeks ago.  Since starting, she is noted significant improvement in her anxiety levels.  She reports improved energy, less sadness, no panic attacks.  Still complains of poor sleep, however she is now working the night shift.  Also struggling with anger and irritability given the difficulties with her ex-husband, son and new grandbaby.  No adverse effects from medications. She denies current suicidal or homicidal plan or intent.  [facility] 11/07/2018 10/03/2018 05/17/2018  Decreased Interest 1 3 0  Down, Depressed, Hopeless 1 3 0  PHQ - 2 Score 2 6 0  Altered sleeping 2 1 0  Tired, decreased energy 2 1 0  Change in appetite 0 1 0  Feeling bad or failure about yourself  1 1 0  Trouble concentrating 1 3 0  Moving slowly or fidgety/restless 0 0 0  Suicidal thoughts 0 0 0  PHQ-9 Score 8 13 0  Difficult doing work/chores Somewhat difficult Somewhat difficult Not difficult at all   GAD 7 : Generalized Anxiety Score 11/07/2018  Nervous, Anxious, on Edge 0  Control/stop worrying 2  Worry too much - different things 2  Trouble relaxing 1  Restless 0  Easily annoyed or irritable 2  Afraid - awful might happen 1  Total GAD 7 Score 8  Anxiety Difficulty Somewhat difficult      Assessment  1. Panic disorder       Plan   Panic/depression/anxiety symptoms: Much improved on Lexapro.  Continue medications for now.  Further counseling done.  Refilled diclofenac for tennis elbow.  Follow up: No follow-ups on file.  No orders of the defined types were placed in this encounter.  Meds ordered this  encounter  Medications  . diclofenac (VOLTAREN) 75 MG EC tablet    Sig: Take 1 tablet (75 mg total) by mouth as needed.    Dispense:  60 tablet    Refill:  1  . hydrOXYzine (ATARAX/VISTARIL) 25 MG tablet    Sig: Take 1 tablet (25 mg total) by mouth at bedtime as needed for anxiety (and insomnia).    Dispense:  30 tablet    Refill:  5      I reviewed the patients updated PMH, FH, and SocHx.    Patient Active Problem List   Diagnosis Date Noted  . Panic disorder 10/03/2018  . Dependence on nicotine from cigarettes 05/17/2018  . History of abnormal cervical Pap smear 05/17/2018  . Irritable bowel syndrome (IBS) 05/17/2018  . Encounter for Depo-Provera contraception 01/13/2018  . Allergic rhinitis 04/02/2014   Current Meds  Medication Sig  . diclofenac (VOLTAREN) 75 MG EC tablet Take 1 tablet (75 mg total) by mouth as needed.  Marland Kitchen. escitalopram (LEXAPRO) 10 MG tablet Take 1 tablet (10 mg total) by mouth daily.  . [DISCONTINUED] diclofenac (VOLTAREN) 75 MG EC tablet Take 75 mg by mouth as needed.   Current Facility-Administered Medications for the 11/07/18 encounter (Office Visit) with [name], Dwana Garin L, MD  Medication  . medroxyPROGESTERone (DEPO-PROVERA) injection 150 mg    Allergies: Patient is allergic to prednisone.  Family history:  Patient family history includes Alcohol abuse in her maternal grandfather and paternal grandfather; COPD in her paternal grandmother; Healthy in her son; Hypertension in her father; Kidney disease in her mother; Learning disabilities in her mother. Social History   Socioeconomic History  . Marital status: Single    Spouse name: Not on file  . Number of children: 1  . Years of education: Not on file  . Highest education level: Not on file  Occupational History  . Occupation: assist [employer]  . Financial resource strain: Not on file  . Food insecurity:    Worry: Not on file    Inability: Not on file  .  Transportation needs:    Medical: Not on file    Non-medical: Not on file  Tobacco Use  . Smoking status: Current Every Day Smoker    Packs/day: 1.00    Years: 24.00    Pack years: 24.00    Types: Cigarettes  . Smokeless tobacco: Never Used  . Tobacco comment: counseled verbally during visits  Substance and Sexual Activity  . Alcohol use: Yes  . Drug use: Never  . Sexual activity: Yes    Birth control/protection: Injection  Lifestyle  . Physical activity:    Days per week: Not on file    Minutes per session: Not on file  . Stress: Not on file  Relationships  . Social connections:    Talks on phone: Not on file    Gets together: Not on file    Attends religious service: Not on file    Active member of club or organization: Not on file    Attends meetings of clubs or organizations: Not on file    Relationship status: Not on file  Other Topics Concern  . Not on file  Social History Narrative   Never married, single; lives with son and dog; one granddaughter born 2019.      Review of Systems: Constitutional: Negative for fever malaise or anorexia Cardiovascular: negative for chest pain Respiratory: negative for SOB or persistent cough Gastrointestinal: negative for abdominal pain  Objective  Vitals: BP 118/68   Pulse 75   Temp 98.3 F (36.8 C)   Ht 5\' 4"  (1.626 m)   Wt 106 lb 6.4 oz (48.3 kg)   SpO2 98%   BMI 18.26 kg/m  General: no acute distress, well appearing, no apparent distress, well groomed Psych:  Alert and oriented x 3,normal mood, behavior, speech, dress, and thought processes.   Commons side effects, risks, benefits, and alternatives for medications and treatment plan prescribed today were discussed, and the patient expressed understanding of the given instructions. Patient is instructed to call or message via MyChart if he/she has any questions or concerns regarding our treatment plan. No barriers to understanding were identified. We discussed Red Flag  symptoms and signs in detail. Patient expressed understanding regarding what to do in case of urgent or emergency type symptoms.   Medication list was reconciled, printed and provided to the patient in AVS. Patient instructions and summary information was reviewed with the patient as documented in the AVS. This note was prepared with assistance of Dragon voice recognition software. Occasional wrong-word or sound-a-like substitutions may have occurred due to the inherent limitations of voice recognition software

## 2018-11-07 NOTE — Patient Instructions (Signed)
Please return in 3 months for Mood recheck.   If you have any questions or concerns, please don't hesitate to send me a message via MyChart or call the office at [phone]. Thank you for visiting with us today! It's our pleasure caring for you.

## 2018-12-21 ENCOUNTER — Ambulatory Visit (INDEPENDENT_AMBULATORY_CARE_PROVIDER_SITE_OTHER): Payer: 59

## 2018-12-21 ENCOUNTER — Telehealth: Payer: Self-pay

## 2018-12-21 DIAGNOSIS — Z3042 Encounter for surveillance of injectable contraceptive: Secondary | ICD-10-CM | POA: Diagnosis not present

## 2018-12-21 MED ORDER — MEDROXYPROGESTERONE ACETATE 150 MG/ML IM SUSP
150.0000 mg | Freq: Once | INTRAMUSCULAR | Status: AC
  Administered 2018-12-21: 150 mg via INTRAMUSCULAR

## 2018-12-21 NOTE — Progress Notes (Signed)
Kristine Holder is a 43 y.o. female presents to the office today for Depo Provera injections, per physician's orders. Depo Provera 150mg /ml was administered IM in right ventrogluteal today. Patient tolerated injection. Patient due for follow up labs/provider appt: F/U mood. Date due: 02/06/2019, appt made Yes Patient next injection due: 03/22/2019, appt made: No.    Alysia Penna

## 2018-12-21 NOTE — Telephone Encounter (Signed)
Pt's local pharmacy advised patient she will need to fill Lexapro Rx at mail order pharmacy ([facility]) d/t long term medication. Patient requesting new prescription for Lexapro 10mg  to be sent to [facility] (on file).   Next appt with PCP for mood f/u 02/06/2019.  Please advise.

## 2018-12-22 ENCOUNTER — Other Ambulatory Visit: Payer: Self-pay | Admitting: *Deleted

## 2018-12-22 MED ORDER — ESCITALOPRAM OXALATE 10 MG PO TABS: 10.0000 mg | ORAL_TABLET | Freq: Every day | ORAL | 3 refills | Status: DC

## 2018-12-22 NOTE — Telephone Encounter (Signed)
[facility] as [facility]. 90 with 3 refills. thanks

## 2018-12-22 NOTE — Telephone Encounter (Signed)
Medication has been refilled and sent to [facility] 

## 2019-02-06 ENCOUNTER — Ambulatory Visit: Payer: 59 | Admitting: Family Medicine

## 2019-03-01 ENCOUNTER — Ambulatory Visit: Payer: 59 | Admitting: Family Medicine

## 2019-03-06 ENCOUNTER — Encounter: Payer: 59 | Admitting: Family Medicine

## 2019-03-06 NOTE — Progress Notes (Signed)
Patient was called twice [phone] for phone visit f/u and messages left. No answer and no return call.

## 2019-03-06 NOTE — Progress Notes (Signed)
This encounter was created in error - please disregard.

## 2019-03-06 NOTE — Patient Instructions (Signed)
Please return in June 2020 for your annual complete physical; please come fasting.   If you have any questions or concerns, please don't hesitate to send me a message via MyChart or call the office at [phone]. Thank you for visiting with [place] today! It's our pleasure caring for you.

## 2019-03-14 ENCOUNTER — Ambulatory Visit (INDEPENDENT_AMBULATORY_CARE_PROVIDER_SITE_OTHER): Payer: 59 | Admitting: Family Medicine

## 2019-03-14 ENCOUNTER — Encounter: Payer: Self-pay | Admitting: Family Medicine

## 2019-03-14 VITALS — Temp 98.7°F | Wt 107.0 lb

## 2019-03-14 DIAGNOSIS — J208 Acute bronchitis due to other specified organisms: Secondary | ICD-10-CM | POA: Diagnosis not present

## 2019-03-14 DIAGNOSIS — R8290 Unspecified abnormal findings in urine: Secondary | ICD-10-CM | POA: Diagnosis not present

## 2019-03-14 DIAGNOSIS — F410 Panic disorder [episodic paroxysmal anxiety] without agoraphobia: Secondary | ICD-10-CM

## 2019-03-14 DIAGNOSIS — F17210 Nicotine dependence, cigarettes, uncomplicated: Secondary | ICD-10-CM | POA: Diagnosis not present

## 2019-03-14 DIAGNOSIS — B9689 Other specified bacterial agents as the cause of diseases classified elsewhere: Secondary | ICD-10-CM

## 2019-03-14 MED ORDER — CEPHALEXIN 500 MG PO CAPS: 500.0000 mg | ORAL_CAPSULE | Freq: Two times a day (BID) | ORAL | 0 refills | Status: DC

## 2019-03-14 NOTE — Progress Notes (Signed)
Virtual Visit via Video Note  Subjective  CC:  Chief Complaint  Patient presents with  . URI    Symptoms started 03/13/19. congestion, runny nose, cough, and chest tightness  . Urinary Tract Infection    Right side pain, home UTI test showed positive leukocytes   . Anxiety    HPI:  I connected with Kristine Holder on 03/14/19 at 10:30 AM EDT by a video enabled telemedicine application and verified that I am speaking with the correct person using two identifiers. Location patient: Home Location provider: [facility], Office Persons participating in the virtual visit: Geraldine Solar, MD Rita Ohara, CMA   I discussed the limitations of evaluation and management by telemedicine and the availability of in person appointments. The patient expressed understanding and agreed to proceed. . Pt reports 24-48 hours of URI sxs with nasal congestion, sneezing, hacking cough, clear productive cough that hurst in chest. Feels heavy in chest but denies wheezing. Pt is a chronic smoker. Denies sob. No fever with tmax 99 this am. Pain in chest with certain positions. Denies st or body aches. No n/v/d. Appetite is fine.  . C/o right "side pain like I did when I had a kidney infection". Denies fevers, dysuria, gross hematuria, gyn sxs or pelvic pain. Took otc urine screen and it was positive for leukocytes. No h/o renal stone. I reviewed notes from 2018 when she was treated for pyelo with keflex. She had intolerance to cipro. In 2019, she reported similar sxs but urine cultures were negative at that time.  . Panic is well controlled on lexapro. Increased work stress but coping well. Getting worn down physically. Sleep is good. Stopped hydroxyzine: didn't feel like it helped sleep.   GAD 7 : Generalized Anxiety Score 11/07/2018  Nervous, Anxious, on Edge 0  Control/stop worrying 2  Worry too much - different things 2  Trouble relaxing 1  Restless 0  Easily  annoyed or irritable 2  Afraid - awful might happen 1  Total GAD 7 Score 8  Anxiety Difficulty Somewhat difficult    Depression screen [facility] 2/9 03/14/2019 03/14/2019 11/07/2018 10/03/2018 05/17/2018  Decreased Interest 1 - 1 3 0  Down, Depressed, Hopeless 1 1 1 3  0  PHQ - 2 Score 2 1 2 6  0  Altered sleeping 0 - 2 1 0  Tired, decreased energy 1 - 2 1 0  Change in appetite 0 - 0 1 0  Feeling bad or failure about yourself  0 - 1 1 0  Trouble concentrating 1 - 1 3 0  Moving slowly or fidgety/restless 0 - 0 0 0  Suicidal thoughts 0 - 0 0 0  PHQ-9 Score 4 - 8 13 0  Difficult doing work/chores Not difficult at all - Somewhat difficult Somewhat difficult Not difficult at all    Assessment  1. Panic disorder   2. Acute bacterial bronchitis   3. Cigarette nicotine dependence without complication   4. Abnormal result on screening urine test      Plan   panic:  Stable on lexapro.   URI sxs with low grade fever during coronavirus pandemic: not advisable to bring in to office for eval, recommend stay at home with self isolation for 3 days. Monitor fevers and symptoms. Work note left at front for pick up tomorrow. Start keflex for possible bronchitis in smoker. Educated to monitor for any worsening respiratory sxs.   Flank pain with abnl urine screen:  start keflex and monitor sxs. Can't bring in to office given URI sxs with low grade fever. F/u if not improving.   I discussed the assessment and treatment plan with the patient. The patient was provided an opportunity to ask questions and all were answered. The patient agreed with the plan and demonstrated an understanding of the instructions.   The patient was advised to call back or seek an in-person evaluation if the symptoms worsen or if the condition fails to improve as anticipated. Follow up: Return in about 8 weeks (around 05/09/2019) for complete physical.  03/23/2019  Meds ordered this encounter  Medications  . cephALEXin (KEFLEX) 500 MG  capsule    Sig: Take 1 capsule (500 mg total) by mouth 2 (two) times daily.    Dispense:  14 capsule    Refill:  0      I reviewed the patients updated PMH, FH, and SocHx.    Patient Active Problem List   Diagnosis Date Noted  . Panic disorder 10/03/2018  . Dependence on nicotine from cigarettes 05/17/2018  . History of abnormal cervical Pap smear 05/17/2018  . Irritable bowel syndrome (IBS) 05/17/2018  . Encounter for Depo-Provera contraception 01/13/2018  . Allergic rhinitis 04/02/2014   Current Meds  Medication Sig  . diclofenac (VOLTAREN) 75 MG EC tablet Take 1 tablet (75 mg total) by mouth as needed.  Marland Kitchen. escitalopram (LEXAPRO) 10 MG tablet Take 1 tablet (10 mg total) by mouth daily.   Current Facility-Administered Medications for the 03/14/19 encounter (Office Visit) with [name], Priya Matsen L, MD  Medication  . medroxyPROGESTERone (DEPO-PROVERA) injection 150 mg    Allergies: Patient is allergic to prednisone and ciprofloxacin hcl. Family History: Patient family history includes Alcohol abuse in her maternal grandfather and paternal grandfather; COPD in her paternal grandmother; Healthy in her son; Hypertension in her father; Kidney disease in her mother; Learning disabilities in her mother. Social History:  Patient  reports that she has been smoking cigarettes. She has a 24.00 pack-year smoking history. She has never used smokeless tobacco. She reports current alcohol use. She reports that she does not use drugs.  Review of Systems: Constitutional: Negative for fever malaise or anorexia Cardiovascular: negative for chest pain Respiratory: negative for SOB or persistent cough Gastrointestinal: negative for abdominal pain  OBJECTIVE Vitals: Temp 98.7 F (37.1 C) (Oral)   Wt 107 lb (48.5 kg)   BMI 18.37 kg/m  General: no acute distress , A&Ox3, normal speech, speaking clearly and in full sentenced.   Willow Oraamille L Dehaven Sine, MD   Interactive audio and video telecommunications  were attempted between this provider and patient, however failed, due to patient having technical difficulties OR patient did not have access to video capability.  We continued and completed visit with audio only.

## 2019-03-14 NOTE — Patient Instructions (Signed)
Please return in June 2020 for your annual complete physical; please come fasting.   If you have any questions or concerns, please don't hesitate to send me a message via MyChart or call the office at [phone]. Thank you for visiting with [place] today! It's our pleasure caring for you.

## 2019-03-16 ENCOUNTER — Telehealth: Payer: Self-pay | Admitting: Family Medicine

## 2019-03-16 NOTE — Telephone Encounter (Signed)
Pt wanted me to let you know that she is still having the chest pain and side pain. She states that she has NO fever. Pt can be reached at the home #

## 2019-03-16 NOTE — Telephone Encounter (Signed)
Or needs an office visit or urgent care evaluation.

## 2019-03-20 NOTE — Telephone Encounter (Signed)
Please call pt to check and see how she is doing today.

## 2019-03-20 NOTE — Telephone Encounter (Signed)
LMOVM to check on pt

## 2019-03-22 NOTE — Telephone Encounter (Signed)
Spoke with pt and she reports that both pains have gotten better and she will callback if needed

## 2019-03-23 ENCOUNTER — Ambulatory Visit: Payer: 59

## 2019-03-23 ENCOUNTER — Ambulatory Visit (INDEPENDENT_AMBULATORY_CARE_PROVIDER_SITE_OTHER): Payer: 59

## 2019-03-23 DIAGNOSIS — Z30019 Encounter for initial prescription of contraceptives, unspecified: Secondary | ICD-10-CM

## 2019-03-23 MED ORDER — MEDROXYPROGESTERONE ACETATE 150 MG/ML IM SUSP
150.0000 mg | Freq: Once | INTRAMUSCULAR | Status: AC
  Administered 2019-03-23: 150 mg via INTRAMUSCULAR

## 2019-03-23 NOTE — Progress Notes (Addendum)
Administered DEPO-PROVERA IM right glute. Patient tolerated well.   The above order is mine.  Neena Rhymes, MD

## 2019-06-22 ENCOUNTER — Ambulatory Visit (INDEPENDENT_AMBULATORY_CARE_PROVIDER_SITE_OTHER): Payer: 59

## 2019-06-22 ENCOUNTER — Other Ambulatory Visit: Payer: Self-pay

## 2019-06-22 DIAGNOSIS — Z3042 Encounter for surveillance of injectable contraceptive: Secondary | ICD-10-CM | POA: Diagnosis not present

## 2019-06-22 MED ORDER — MEDROXYPROGESTERONE ACETATE 150 MG/ML IM SUSP
150.0000 mg | Freq: Once | INTRAMUSCULAR | Status: AC
  Administered 2019-06-22: 150 mg via INTRAMUSCULAR

## 2019-06-22 NOTE — Progress Notes (Addendum)
Per orders of Dr. Jonni Sanger, injection of Medroxyprogesterone given by Verline Lema L Tyus in right deltoid. Patient tolerated injection well. Patient will make appointment for 3 months

## 2019-06-22 NOTE — Patient Instructions (Signed)
There are no preventive care reminders to display for this patient.  Depression screen [facility] 2/9 03/14/2019 03/14/2019 11/07/2018  Decreased Interest 1 - 1  Down, Depressed, Hopeless 1 1 1   PHQ - 2 Score 2 1 2   Altered sleeping 0 - 2  Tired, decreased energy 1 - 2  Change in appetite 0 - 0  Feeling bad or failure about yourself  0 - 1  Trouble concentrating 1 - 1  Moving slowly or fidgety/restless 0 - 0  Suicidal thoughts 0 - 0  PHQ-9 Score 4 - 8  Difficult doing work/chores Not difficult at all - Somewhat difficult

## 2019-07-11 ENCOUNTER — Encounter: Payer: Self-pay | Admitting: Physician Assistant

## 2019-07-11 ENCOUNTER — Ambulatory Visit: Payer: 59 | Admitting: Physician Assistant

## 2019-07-11 ENCOUNTER — Other Ambulatory Visit: Payer: Self-pay

## 2019-07-11 VITALS — BP 98/60 | HR 81 | Temp 98.7°F | Resp 14 | Ht 64.0 in | Wt 105.0 lb

## 2019-07-11 DIAGNOSIS — R300 Dysuria: Secondary | ICD-10-CM

## 2019-07-11 LAB — POCT URINALYSIS DIPSTICK
Bilirubin, UA: POSITIVE
Blood, UA: NEGATIVE
Glucose, UA: NEGATIVE
Ketones, UA: POSITIVE
Leukocytes, UA: NEGATIVE
Nitrite, UA: NEGATIVE
Protein, UA: POSITIVE — AB
Spec Grav, UA: >=1.03 — AB (ref 1.010–1.025)
Urobilinogen, UA: 0.2 E.U./dL
pH, UA: 6 (ref 5.0–8.0)

## 2019-07-11 MED ORDER — SULFAMETHOXAZOLE-TRIMETHOPRIM 800-160 MG PO TABS: 1.0000 | ORAL_TABLET | Freq: Two times a day (BID) | ORAL | 0 refills | Status: DC

## 2019-07-11 NOTE — Patient Instructions (Signed)
Your symptoms are consistent with a bladder infection, also called acute cystitis. Please take your antibiotic (Bactrim) as directed until all pills are gone.  Stay very well hydrated.  Consider a daily probiotic (Align, Culturelle, or Activia) to help prevent stomach upset caused by the antibiotic.  Taking a probiotic daily may also help prevent recurrent UTIs.  Also consider taking AZO (Phenazopyridine) tablets to help decrease pain with urination.  I will call you with your urine testing results.  We will change antibiotics if indicated.  Call or return to clinic if symptoms are not resolved by completion of antibiotic.   Urinary Tract Infection A urinary tract infection (UTI) can occur any place along the urinary tract. The tract includes the kidneys, ureters, bladder, and urethra. A type of germ called bacteria often causes a UTI. UTIs are often helped with antibiotic medicine.  HOME CARE   If given, take antibiotics as told by your doctor. Finish them even if you start to feel better.  Drink enough fluids to keep your pee (urine) clear or pale yellow.  Avoid tea, drinks with caffeine, and bubbly (carbonated) drinks.  Pee often. Avoid holding your pee in for a long time.  Pee before and after having sex (intercourse).  Wipe from front to back after you poop (bowel movement) if you are a woman. Use each tissue only once. GET HELP RIGHT AWAY IF:   You have back pain.  You have lower belly (abdominal) pain.  You have chills.  You feel sick to your stomach (nauseous).  You throw up (vomit).  Your burning or discomfort with peeing does not go away.  You have a fever.  Your symptoms are not better in 3 days. MAKE SURE YOU:   Understand these instructions.  Will watch your condition.  Will get help right away if you are not doing well or get worse. Document Released: 05/11/2008 Document Revised: 08/17/2012 Document Reviewed: 06/23/2012 ExitCare Patient Information 2015  [employer]. This information is not intended to replace advice given to you by your health care provider. Make sure you discuss any questions you have with your health care provider.   

## 2019-07-11 NOTE — Progress Notes (Signed)
EYC:XKGY, Karie Fetch, MD Chief Complaint  Patient presents with  . Urinary Tract Infection    Started 1 week ago. Symptoms are worsening. Urinary frequency, some dysuria, nausea, right flank pain. Increased hydration, cranberry juice     Current Issues:  Presents with 7 days of dysuria, urinary urgency and urinary frequency Associated symptoms include:  chills, cloudy urine, lower abdominal pain, urinary hesitancy and r-sided low back pain  There is a previous history of of similar symptoms. Sexually active:  No   No concern for STI.  Prior to Admission medications   Medication Sig Start Date End Date Taking? Authorizing Provider  diclofenac (VOLTAREN) 75 MG EC tablet Take 1 tablet (75 mg total) by mouth as needed. 11/07/18  Yes Leamon Arnt, MD  escitalopram (LEXAPRO) 10 MG tablet Take 1 tablet (10 mg total) by mouth daily. 12/22/18  Yes Leamon Arnt, MD    Review of Systems:Pertinent ROS are listed in the HPI.  PE:  Wt 105 lb (47.6 kg)   BMI 18.02 kg/m    Physical Exam  Constitutional: She is well-developed, well-nourished, and in no distress.  HENT:  Head: Normocephalic and atraumatic.  Cardiovascular: Normal rate, regular rhythm, normal heart sounds and intact distal pulses.  Pulmonary/Chest: Effort normal.  Abdominal: Soft. Bowel sounds are normal. She exhibits no distension. There is abdominal tenderness in the suprapubic area. There is no CVA tenderness.  Vitals reviewed.  Results for orders placed or performed in visit on 08/26/18  CBC with Differential/Platelet  Result Value Ref Range   WBC 10.1 4.0 - 10.5 K/uL   RBC 4.48 3.87 - 5.11 Mil/uL   Hemoglobin 13.4 12.0 - 15.0 g/dL   HCT 40.7 36.0 - 46.0 %   MCV 90.8 78.0 - 100.0 fl   MCHC 33.0 30.0 - 36.0 g/dL   RDW 14.1 11.5 - 15.5 %   Platelets 347.0 150.0 - 400.0 K/uL   Neutrophils Relative % 70.9 43.0 - 77.0 %   Lymphocytes Relative 20.5 12.0 - 46.0 %   Monocytes Relative 6.0 3.0 - 12.0 %   Eosinophils  Relative 1.9 0.0 - 5.0 %   Basophils Relative 0.7 0.0 - 3.0 %   Neutro Abs 7.1 1.4 - 7.7 K/uL   Lymphs Abs 2.1 0.7 - 4.0 K/uL   Monocytes Absolute 0.6 0.1 - 1.0 K/uL   Eosinophils Absolute 0.2 0.0 - 0.7 K/uL   Basophils Absolute 0.1 0.0 - 0.1 K/uL  Comprehensive metabolic panel  Result Value Ref Range   Sodium 141 135 - 145 mEq/L   Potassium 4.4 3.5 - 5.1 mEq/L   Chloride 108 96 - 112 mEq/L   CO2 26 19 - 32 mEq/L   Glucose, Bld 61 (L) 70 - 99 mg/dL   BUN 8 6 - 23 mg/dL   Creatinine, Ser 0.80 0.40 - 1.20 mg/dL   Total Bilirubin 0.3 0.2 - 1.2 mg/dL   Alkaline Phosphatase 52 39 - 117 U/L   AST 13 0 - 37 U/L   ALT 9 0 - 35 U/L   Total Protein 6.8 6.0 - 8.3 g/dL   Albumin 4.4 3.5 - 5.2 g/dL   Calcium 9.6 8.4 - 10.5 mg/dL   GFR 83.24 >60.00 mL/min  D-dimer, quantitative (not at [facility])  Result Value Ref Range   D-Dimer, Quant 0.26 <0.50 mcg/mL FEU  POCT urinalysis dipstick  Result Value Ref Range   Color, UA Yellow    Clarity, UA Clear    Glucose, UA Negative Negative  Bilirubin, UA Negative    Ketones, UA Negative    Spec Grav, UA 1.015 1.010 - 1.025   Blood, UA Negative    pH, UA 6.0 5.0 - 8.0   Protein, UA Negative Negative   Urobilinogen, UA 0.2 0.2 or 1.0 E.U./dL   Nitrite, UA Neg    Leukocytes, UA Negative Negative   Appearance     Odor      Assessment and Plan:   1. Dysuria Urine dip negative but classic symptoms and + history. Will send for culture. Start Bactrim BID x 5 days. Supportive measures and OTC medications reviewed.  - Urine culture - POCT urinalysis dipstick

## 2019-07-13 ENCOUNTER — Telehealth: Payer: Self-pay | Admitting: Physician Assistant

## 2019-07-13 LAB — URINE CULTURE
MICRO NUMBER:: 735848
Result:: NO GROWTH
SPECIMEN QUALITY:: ADEQUATE

## 2019-07-13 NOTE — Telephone Encounter (Signed)
I have made pt aware of the lab results and asked how she was doing today.  She states that she still feels bad and that she is still having the right side pain. Pt states that she did try to work today but they sent her home. Pt can be reached at the home #

## 2019-07-13 NOTE — Telephone Encounter (Signed)
Message sent to [facility] for review and recommendations.

## 2019-07-23 IMAGING — DX DG CHEST 2V
2 series · 2 of 2 positions shown · non-contrast
Comparison: None.

CLINICAL DATA: Pleuritic chest pain

EXAM:
CHEST - 2 VIEW

[chest pa]
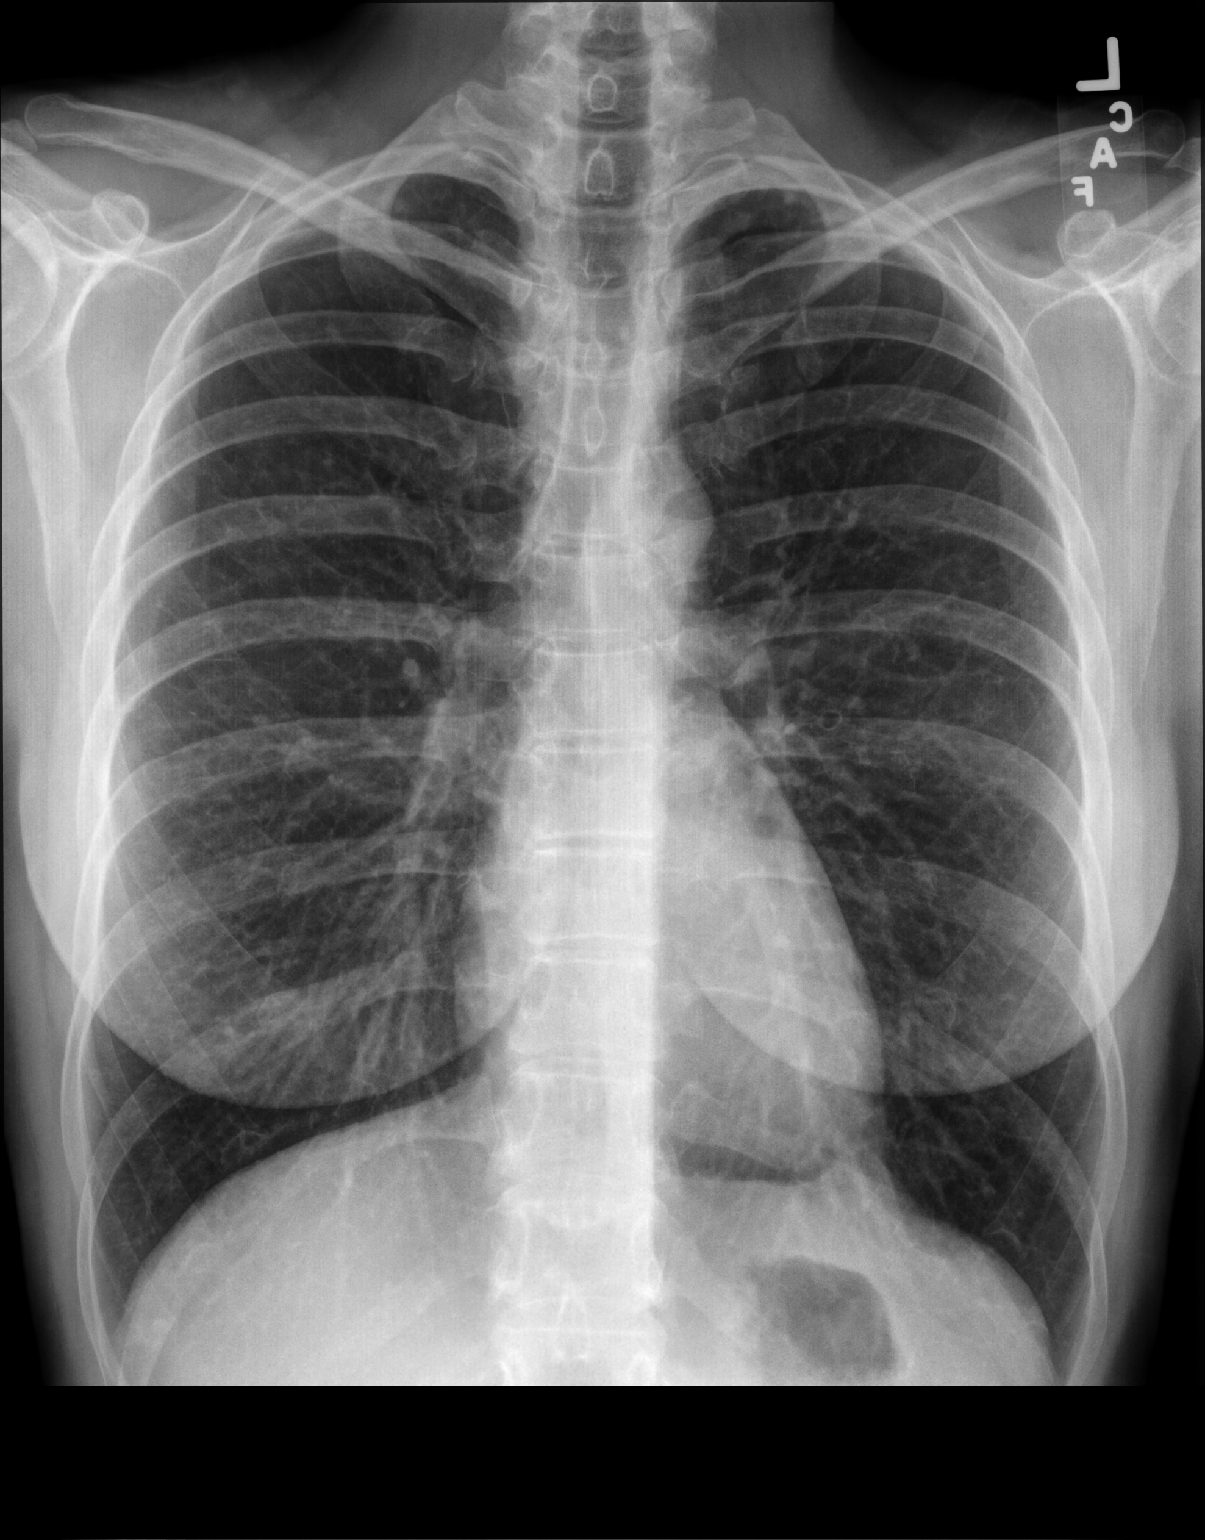

[chest lat]
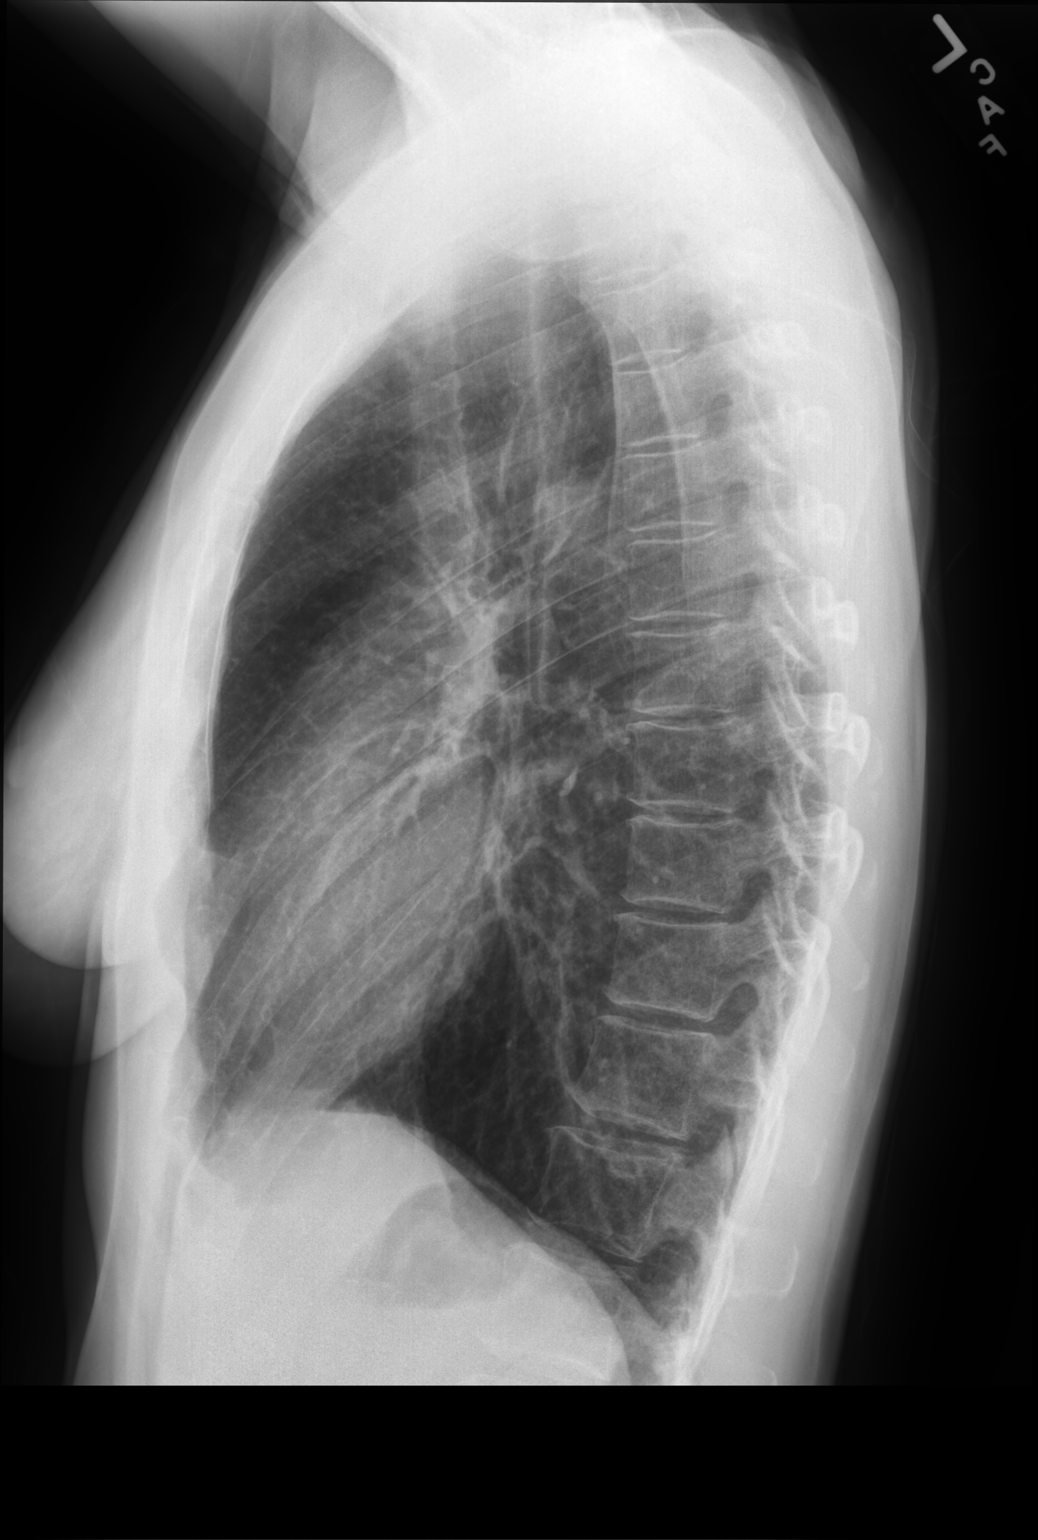

[2 of 2 positions shown; findings below may reference images not displayed]

FINDINGS: Lungs are clear. Heart size and pulmonary vascularity are normal. No
adenopathy. No pneumothorax. No bone lesions.
IMPRESSION: No abnormality noted.

## 2019-09-21 ENCOUNTER — Other Ambulatory Visit: Payer: Self-pay

## 2019-09-21 ENCOUNTER — Ambulatory Visit: Payer: 59

## 2019-09-21 ENCOUNTER — Ambulatory Visit (INDEPENDENT_AMBULATORY_CARE_PROVIDER_SITE_OTHER): Payer: 59

## 2019-09-21 DIAGNOSIS — Z3042 Encounter for surveillance of injectable contraceptive: Secondary | ICD-10-CM | POA: Diagnosis not present

## 2019-09-21 MED ORDER — MEDROXYPROGESTERONE ACETATE 150 MG/ML IM SUSP
150.0000 mg | Freq: Once | INTRAMUSCULAR | Status: AC
  Administered 2019-09-21: 150 mg via INTRAMUSCULAR

## 2019-09-21 NOTE — Progress Notes (Deleted)
Per orders of Dr. Jonni Sanger , injection of Depo  given by Francella Solian in left deltoid. Patient tolerated injection well. Patient will make appointment for 60month between 12/31 and 1/14.

## 2019-09-21 NOTE — Progress Notes (Signed)
Per orders of Dr. Jonni Sanger, injection of Depo Provera, 150 150 mg/ml given right upper outer quadrant IM by Kevan Ny, CMA Patient tolerated injection well.

## 2019-09-22 ENCOUNTER — Encounter: Payer: Self-pay | Admitting: Family Medicine

## 2019-09-22 ENCOUNTER — Ambulatory Visit: Payer: 59 | Admitting: Family Medicine

## 2019-09-22 VITALS — BP 98/52 | HR 80 | Temp 98.2°F | Resp 14 | Ht 64.0 in | Wt 106.2 lb

## 2019-09-22 DIAGNOSIS — L709 Acne, unspecified: Secondary | ICD-10-CM | POA: Insufficient documentation

## 2019-09-22 DIAGNOSIS — Z23 Encounter for immunization: Secondary | ICD-10-CM | POA: Diagnosis not present

## 2019-09-22 DIAGNOSIS — F410 Panic disorder [episodic paroxysmal anxiety] without agoraphobia: Secondary | ICD-10-CM

## 2019-09-22 MED ORDER — CLINDAMYCIN PHOSPHATE 1 % EX SOLN: Freq: Two times a day (BID) | CUTANEOUS | 3 refills | Status: DC

## 2019-09-22 MED ORDER — ESCITALOPRAM OXALATE 20 MG PO TABS: 20.0000 mg | ORAL_TABLET | Freq: Every day | ORAL | 3 refills | Status: DC

## 2019-09-22 MED ORDER — ALPRAZOLAM 0.25 MG PO TABS: 0.2500 mg | ORAL_TABLET | Freq: Every day | ORAL | 0 refills | Status: DC | PRN

## 2019-09-22 NOTE — Progress Notes (Signed)
Subjective  CC:  Chief Complaint  Patient presents with  . Panic Attacks    Reports that she is having increase in panic attacks  . Mouth Lesions    Happens after eating certain foods, takes the Sulfa and clears up    HPI: JOHNNETTE LAUX is a 43 y.o. female who presents to the office today to address the problems listed above in the chief complaint, mood problems.  Mood and anxiety are worsening. Work and home stressors are up. On lexapro which is helping. Had great initial response. Having more mild panic attacks affecting work.   Gets acne like pustules; believe associated with acidic foods like tomato sauces. Uses one bactrim and it clears up. Has some mild adult acne. No rosacea like redness or soreness in T zone. No lesions inside of mouth or on lips.  GAD 7 : Generalized Anxiety Score 09/22/2019 11/07/2018  Nervous, Anxious, on Edge 1 0  Control/stop worrying 1 2  Worry too much - different things 1 2  Trouble relaxing 3 1  Restless 1 0  Easily annoyed or irritable 2 2  Afraid - awful might happen 3 1  Total GAD 7 Score 12 8  Anxiety Difficulty Not difficult at all Somewhat difficult     Assessment  1. Panic disorder   2. Adult acne   3. Need for immunization against influenza      Plan   GAD/panic:  Increase lexapro to 20 and use prn low dose xanax for panic attacks if needed. Education given on appropriate use and expectations.   Pustular acne: trial of topical clinda and food avoidance.  Follow up: No follow-ups on file.  Orders Placed This Encounter  Procedures  . Flu Vaccine QUAD 36+ mos IM   Meds ordered this encounter  Medications  . escitalopram (LEXAPRO) 20 MG tablet    Sig: Take 1 tablet (20 mg total) by mouth daily.    Dispense:  90 tablet    Refill:  3  . clindamycin (CLEOCIN-T) 1 % external solution    Sig: Apply topically 2 (two) times daily.    Dispense:  60 mL    Refill:  3  . ALPRAZolam (XANAX) 0.25 MG tablet    Sig: Take 1 tablet  (0.25 mg total) by mouth daily as needed for anxiety (panic attack).    Dispense:  20 tablet    Refill:  0      I reviewed the patients updated PMH, FH, and SocHx.    Patient Active Problem List   Diagnosis Date Noted  . Adult acne 09/22/2019  . Panic disorder 10/03/2018  . Dependence on nicotine from cigarettes 05/17/2018  . History of abnormal cervical Pap smear 05/17/2018  . Irritable bowel syndrome (IBS) 05/17/2018  . Encounter for Depo-Provera contraception 01/13/2018  . Allergic rhinitis 04/02/2014   Current Meds  Medication Sig  . diclofenac (VOLTAREN) 75 MG EC tablet Take 1 tablet (75 mg total) by mouth as needed.  Marland Kitchen escitalopram (LEXAPRO) 20 MG tablet Take 1 tablet (20 mg total) by mouth daily.  Marland Kitchen sulfamethoxazole-trimethoprim (BACTRIM DS) 800-160 MG tablet Take 1 tablet by mouth 2 (two) times daily.  . [DISCONTINUED] escitalopram (LEXAPRO) 10 MG tablet Take 1 tablet (10 mg total) by mouth daily.   Current Facility-Administered Medications for the 09/22/19 encounter (Office Visit) with Willow Ora, MD  Medication  . medroxyPROGESTERone (DEPO-PROVERA) injection 150 mg    Allergies: Patient is allergic to prednisone and ciprofloxacin hcl. Family  history:  Patient family history includes Alcohol abuse in her maternal grandfather and paternal grandfather; COPD in her paternal grandmother; Healthy in her son; Hypertension in her father; Kidney disease in her mother; Learning disabilities in her mother. Social History   Socioeconomic History  . Marital status: Single    Spouse name: Not on file  . Number of children: 1  . Years of education: Not on file  . Highest education level: Not on file  Occupational History  . 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 Financial resource strain: Not on file  . Food insecurity    Worry: Not on file    Inability: Not on file  . Transportation needs    Medical: Not on file    Non-medical: Not on  file  Tobacco Use  . Smoking status: Current Every Day Smoker    Packs/day: 1.00    Years: 24.00    Pack years: 24.00    Types: Cigarettes  . Smokeless tobacco: Never Used  . Tobacco comment: counseled verbally during visits  Substance and Sexual Activity  . Alcohol use: Yes  . Drug use: Never  . Sexual activity: Yes    Birth control/protection: Injection  Lifestyle  . Physical activity    Days per week: Not on file    Minutes per session: Not on file  . Stress: Not on file  Relationships  . Social Herbalist on phone: Not on file    Gets together: Not on file    Attends religious service: Not on file    Active member of club or organization: Not on file    Attends meetings of clubs or organizations: Not on file    Relationship status: Not on file  Other Topics Concern  . Not on file  Social History Narrative   Never married, single; lives with son and dog; one granddaughter born 2019.      Review of Systems: Constitutional: Negative for fever malaise or anorexia Cardiovascular: negative for chest pain Respiratory: negative for SOB or persistent cough Gastrointestinal: negative for abdominal pain  Objective  Vitals: BP (!) 98/52   Pulse 80   Temp 98.2 F (36.8 C) (Tympanic)   Resp 14   Ht 5\' 4"  (1.626 m)   Wt 106 lb 3.2 oz (48.2 kg)   SpO2 98%   BMI 18.23 kg/m  General: no acute distress, well appearing, no apparent distress, well groomed Psych:  Alert and oriented x 3,normal mood, behavior, speech, dress, and thought processes.  Skin:  Warm, no rashes, some acne scars, no erythema    Commons side effects, risks, benefits, and alternatives for medications and treatment plan prescribed today were discussed, and the patient expressed understanding of the given instructions. Patient is instructed to call or message via MyChart if he/she has any questions or concerns regarding our treatment plan. No barriers to understanding were identified. We discussed  Red Flag symptoms and signs in detail. Patient expressed understanding regarding what to do in case of urgent or emergency type symptoms.   Medication list was reconciled, printed and provided to the patient in AVS. Patient instructions and summary information was reviewed with the patient as documented in the AVS. This note was prepared with assistance of Dragon voice recognition software. Occasional wrong-word or sound-a-like substitutions may have occurred due to the inherent limitations of voice recognition software

## 2019-09-22 NOTE — Patient Instructions (Signed)
Please return for your annual complete physical; please come fasting. Let me know if you are not feeling better on the increased dose of lexapro in about 6 weeks.   If you have any questions or concerns, please don't hesitate to send me a message via MyChart or call the office at [phone]. Thank you for visiting with [place] today! It's our pleasure caring for you.  Today you were given your flu vaccination.

## 2019-09-25 ENCOUNTER — Encounter: Payer: Self-pay | Admitting: Family Medicine

## 2019-11-22 ENCOUNTER — Ambulatory Visit: Payer: Self-pay | Admitting: Family Medicine

## 2019-11-22 NOTE — Telephone Encounter (Signed)
Pt states she was placed on Keflex 11/11/2019 for insect bite, swollen ankle. States developed itching Monday and stopped taking the keflex. Reports this evening itching worsening Itching of right hand and left upper arm, between breasts, "Mostly red from scratching." Denies any pain, no SOB, throat, tongue swelling. No difficulty swallowing.  States she did not take any benadryl as she works nights and "Didn't want to be sleepy."  Care advise given per protocol. Assured pt NT would route to practice for Dr. Tamela Oddi review. Advised ED for any swelling of throat, tongue, difficulty swallowing ,fever, worsening of symptoms. . Pt verbalizes understanding. Reason for Disposition . Mild localized rash  Answer Assessment - Initial Assessment Questions 1. APPEARANCE of RASH: "Describe the rash."    Diffuse red  2. LOCATION: "Where is the rash located?"     Right hand and left arm 3. NUMBER: "How many spots are there?"     "From scratching" 4. SIZE: "How big are the spots?" (Inches, centimeters or compare to size of a coin)      5. ONSET: "When did the rash start?"     Monday 6. ITCHING: "Does the rash itch?" If so, ask: "How bad is the itch?"  (Scale 1-10; or mild, moderate, severe)    severe 7. PAIN: "Does the rash hurt?" If so, ask: "How bad is the pain?"  (Scale 1-10; or mild, moderate, severe)     no 8. OTHER SYMPTOMS: "Do you have any other symptoms?" (e.g., fever)    no  Protocols used: RASH OR REDNESS - LOCALIZED-A-AH

## 2019-11-23 NOTE — Telephone Encounter (Signed)
Reviewed. Can schedule an OV for rash if she'd like. Can be virtual.

## 2019-11-23 NOTE — Telephone Encounter (Signed)
Please schedule patient for OV.  Can be virtual.  Thanks.

## 2019-11-23 NOTE — Telephone Encounter (Signed)
Called pt to schedule, no answer, LVM.  °

## 2019-11-23 NOTE — Telephone Encounter (Signed)
FYI

## 2019-12-22 ENCOUNTER — Encounter: Payer: Self-pay | Admitting: Family Medicine

## 2019-12-22 ENCOUNTER — Ambulatory Visit (INDEPENDENT_AMBULATORY_CARE_PROVIDER_SITE_OTHER): Payer: 59 | Admitting: Family Medicine

## 2019-12-22 VITALS — Temp 99.5°F | Ht 64.0 in | Wt 106.0 lb

## 2019-12-22 DIAGNOSIS — Z20822 Contact with and (suspected) exposure to covid-19: Secondary | ICD-10-CM

## 2019-12-22 NOTE — Progress Notes (Signed)
Virtual Visit via Video Note  Subjective  CC:  Chief Complaint  Patient presents with  . Sore Throat    started Monday  . Cough  . Right ear pain     I connected with Lenore Cordia on 12/22/19 at  1:40 PM EST by a video enabled telemedicine application and verified that I am speaking with the correct person using two identifiers. Location patient: Home Location provider: [facility] [address] Office Persons participating in the virtual visit: Geraldine Solar, MD Gracy Racer, CMA  I discussed the limitations of evaluation and management by telemedicine and the availability of in person appointments. The patient expressed understanding and agreed to proceed. HPI: Kristine Holder is a 44 y.o. female who was contacted today to address the problems listed above in the chief complaint. Marland Kitchen 4-5 day h/o "flu symptoms" - low grade fevers, ST and right ear pain, myalgias and mild cough. No GI sxs or loss of taste or smell. No sob. 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 No known covid exposures. Otherwise healthy. Of note, scheduled for depo injection here in office next week.  Assessment  1. Suspected 2019 novel coronavirus infection      Plan   ? covid 19 infection:  rec testing and instructions to make appt given. Supportive care with nsaids, cough meds and rest and fluids. F/u next week if not improving. Self isolation instruction given.   May need to defer depo injection if she has covid. Pt informed.  I discussed the assessment and treatment plan with the patient. The patient was provided an opportunity to ask questions and all were answered. The patient agreed with the plan and demonstrated an understanding of the instructions.   The patient was advised to call back or seek an in-person evaluation if the symptoms worsen or if the condition fails to improve as anticipated. Follow up: Return for complete physical.    12/26/2019  No orders of the defined types were placed in this encounter.     I reviewed the patients updated PMH, FH, and SocHx.    Patient Active Problem List   Diagnosis Date Noted  . Adult acne 09/22/2019  . Panic disorder 10/03/2018  . Dependence on nicotine from cigarettes 05/17/2018  . History of abnormal cervical Pap smear 05/17/2018  . Irritable bowel syndrome (IBS) 05/17/2018  . Encounter for Depo-Provera contraception 01/13/2018  . Allergic rhinitis 04/02/2014   Current Meds  Medication Sig  . ALPRAZolam (XANAX) 0.25 MG tablet Take 1 tablet (0.25 mg total) by mouth daily as needed for anxiety (panic attack).  . clindamycin (CLEOCIN-T) 1 % external solution Apply topically 2 (two) times daily.  . diclofenac (VOLTAREN) 75 MG EC tablet Take 1 tablet (75 mg total) by mouth as needed.  Marland Kitchen escitalopram (LEXAPRO) 20 MG tablet Take 1 tablet (20 mg total) by mouth daily.   Current Facility-Administered Medications for the 12/22/19 encounter (Office Visit) with Willow Ora, MD  Medication  . medroxyPROGESTERone (DEPO-PROVERA) injection 150 mg    Allergies: Patient is allergic to prednisone and ciprofloxacin hcl. Family History: Patient family history includes Alcohol abuse in her maternal grandfather and paternal grandfather; COPD in her paternal grandmother; Healthy in her son; Hypertension in her father; Kidney disease in her mother; Learning disabilities in her mother. Social History:  Patient  reports that she has been smoking cigarettes. She has a 24.00 pack-year smoking history.  She has never used smokeless tobacco. She reports current alcohol use. She reports that she does not use drugs.  Review of Systems: Constitutional: Negative for fever malaise or anorexia Cardiovascular: negative for chest pain Respiratory: negative for SOB or persistent cough Gastrointestinal: negative for abdominal pain  OBJECTIVE Vitals: Temp 99.5 F (37.5 C) (Oral)   Ht 5\' 4"  (1.626  m)   Wt 106 lb (48.1 kg)   BMI 18.19 kg/m  General: no acute distress , A&Ox3, nontoxic Congestion present  Leamon Arnt, MD

## 2019-12-25 ENCOUNTER — Telehealth: Payer: Self-pay

## 2019-12-25 ENCOUNTER — Other Ambulatory Visit: Payer: 59

## 2019-12-25 NOTE — Telephone Encounter (Signed)
Patient called in to let Dr.[name] know that her covid test was negative and could Dr. Mardelle Matte send over  Something over for sinus infection.

## 2019-12-25 NOTE — Telephone Encounter (Signed)
Please advise 

## 2019-12-26 ENCOUNTER — Ambulatory Visit: Payer: 59

## 2019-12-26 MED ORDER — AMOXICILLIN-POT CLAVULANATE 875-125 MG PO TABS: 1.0000 | ORAL_TABLET | Freq: Two times a day (BID) | ORAL | 0 refills | Status: DC

## 2019-12-26 NOTE — Addendum Note (Signed)
Addended by: Asencion Partridge on: 12/26/2019 03:35 PM   Modules accepted: Orders

## 2019-12-26 NOTE — Telephone Encounter (Signed)
Please call patient to find out where she was tested for covid: I don't see result in her chart.   I have sent in Augmentin for a sinus infection. thanks

## 2019-12-27 NOTE — Telephone Encounter (Signed)
LVM for patient to return call. 

## 2019-12-28 ENCOUNTER — Other Ambulatory Visit: Payer: Self-pay

## 2019-12-28 ENCOUNTER — Encounter: Payer: Self-pay | Admitting: *Deleted

## 2019-12-28 ENCOUNTER — Ambulatory Visit (INDEPENDENT_AMBULATORY_CARE_PROVIDER_SITE_OTHER): Payer: 59 | Admitting: *Deleted

## 2019-12-28 DIAGNOSIS — Z3042 Encounter for surveillance of injectable contraceptive: Secondary | ICD-10-CM | POA: Diagnosis not present

## 2019-12-28 DIAGNOSIS — N912 Amenorrhea, unspecified: Secondary | ICD-10-CM

## 2019-12-28 LAB — POCT URINE PREGNANCY: Preg Test, Ur: NEGATIVE

## 2019-12-28 MED ORDER — MEDROXYPROGESTERONE ACETATE 150 MG/ML IM SUSP
150.0000 mg | Freq: Once | INTRAMUSCULAR | Status: AC
  Administered 2019-12-28: 150 mg via INTRAMUSCULAR

## 2019-12-28 NOTE — Progress Notes (Signed)
Pt here for Depo-Provera injection she is one week late told her will need to do urine pregnancy test before can give injection. Pt verbalized understanding. Urine pregnancy test done prior to injection. Pregnancy test was negative. Per orders of Dr. Jimmey Ralph, injection of Depo-Provera 150 mg  given by Corky Mull in Left Gluteal upper outer quadrant.  Patient tolerated injection well. Patient will make appointment to come back for next injection sometime between April 8 and April 22. Pt verbalized understanding.

## 2020-01-02 NOTE — Telephone Encounter (Signed)
LVM for patient to return my call 

## 2020-01-08 NOTE — Telephone Encounter (Signed)
Unable to reach patient.

## 2020-01-26 ENCOUNTER — Other Ambulatory Visit: Payer: Self-pay

## 2020-01-26 ENCOUNTER — Encounter: Payer: Self-pay | Admitting: Family Medicine

## 2020-01-26 ENCOUNTER — Telehealth: Payer: Self-pay | Admitting: Family Medicine

## 2020-01-26 ENCOUNTER — Ambulatory Visit (INDEPENDENT_AMBULATORY_CARE_PROVIDER_SITE_OTHER): Payer: 59 | Admitting: Family Medicine

## 2020-01-26 VITALS — BP 118/64 | HR 59 | Temp 99.6°F | Ht 64.0 in | Wt 103.8 lb

## 2020-01-26 DIAGNOSIS — M7702 Medial epicondylitis, left elbow: Secondary | ICD-10-CM

## 2020-01-26 DIAGNOSIS — S29012A Strain of muscle and tendon of back wall of thorax, initial encounter: Secondary | ICD-10-CM | POA: Diagnosis not present

## 2020-01-26 DIAGNOSIS — F410 Panic disorder [episodic paroxysmal anxiety] without agoraphobia: Secondary | ICD-10-CM | POA: Diagnosis not present

## 2020-01-26 MED ORDER — DICLOFENAC SODIUM 75 MG PO TBEC: 75.0000 mg | DELAYED_RELEASE_TABLET | Freq: Two times a day (BID) | ORAL | 1 refills | Status: DC | PRN

## 2020-01-26 MED ORDER — KETOROLAC TROMETHAMINE 60 MG/2ML IM SOLN
60.0000 mg | Freq: Once | INTRAMUSCULAR | Status: AC
  Administered 2020-01-26: 12:00:00 60 mg via INTRAMUSCULAR

## 2020-01-26 MED ORDER — KETOROLAC TROMETHAMINE 60 MG/2ML IM SOLN: 60.0000 mg | Freq: Once | INTRAMUSCULAR | Status: DC

## 2020-01-26 MED ORDER — CYCLOBENZAPRINE HCL 10 MG PO TABS: 10.0000 mg | ORAL_TABLET | Freq: Every evening | ORAL | 0 refills | Status: DC | PRN

## 2020-01-26 NOTE — Telephone Encounter (Signed)
Chief Complaint Shoulder Injury Reason for Call Symptomatic / Request for Health Information Initial Comment Caller is having pain from arm to shoulder and hurts when raising arm. Dr Robbie Lis not listed. Translation No Nurse Assessment Nurse: Delafuente, RN, Irma Date/Time (Eastern Time): 01/26/2020 7:20:31 AM Confirm and document reason for call. If symptomatic, describe symptoms. ---Caller she has pain 6/10 to her left arm from her elbow to her the middle of her back. 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 Caller stated she can move her arm. No swelling noted. Has the patient had close contact with a person known or suspected to have the novel coronavirus illness OR traveled / lives in area with major community spread (including international travel) in the last 14 days from the onset of symptoms? * If Asymptomatic, screen for exposure and travel within the last 14 days. ---No Does the patient have any new or worsening symptoms? ---Yes Will a triage be completed? ---Yes Related visit to physician within the last 2 weeks? ---No Does the PT have any chronic conditions? (i.e. diabetes, asthma, this includes High risk factors for pregnancy, etc.) ---No Is the patient pregnant or possibly pregnant? (Ask all females between the ages of 45-55) ---No Is this a behavioral health or substance abuse call? ---No Guidelines Guideline Title Affirmed Question Affirmed Notes Nurse Date/Time (Eastern Time) Arm Pain Numbness (i.e., loss of sensation) in hand or fingers Delafuente, RN, Irma 01/26/2020 7:22:57 AM Disp. Time [name] Time) Disposition Final User PLEASE NOTE: All timestamps contained within this report are represented as [place] Standard Time. CONFIDENTIALTY NOTICE: This fax transmission is intended only for the addressee. It contains information that is legally privileged, confidential or otherwise protected from use or disclosure. If  you are not the intended recipient, you are strictly prohibited from reviewing, disclosing, copying using or disseminating any of this information or taking any action in reliance on or regarding this information. If you have received this fax in error, please notify us immediately by telephone so that we can arrange for its return to [place]. Phone: [phone], Toll-Free: [phone], Fax: [phone] Page: 2 of 2 Call Id: [id number] 01/26/2020 7:26:19 AM See PCP within 24 Hours Yes Delafuente, RN, Darcel Bayley

## 2020-01-26 NOTE — Patient Instructions (Signed)
Please return for your annual complete physical; please come fasting.  Ice your elbow 2-3x/ day for the next few day.  Heat to the upper back.  Rest for 24 hours Use the medications as prescribed for pain and mm spasm.   If you have any questions or concerns, please don't hesitate to send me a message via MyChart or call the office at [phone]. Thank you for visiting with Kristine Holder today! It's our pleasure caring for you.

## 2020-01-26 NOTE — Telephone Encounter (Signed)
Patient has appointment with Dr. Mardelle Matte 01/26/2020 for arm/shoulder pain. See triage message.

## 2020-01-26 NOTE — Progress Notes (Signed)
Subjective  CC:  Chief Complaint  Patient presents with  . Shoulder Pain    symptoms started this morning. left elbow up to back    HPI: Kristine Holder is a 44 y.o. female who presents to the office today to address the problems listed above in the chief complaint.  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 No noted injury; went to bed feeling fine however awoke with left upper back and elbow pain. Describes as sharp and moderate. Was sent home from work today due to pain. Took 400mg  advil with mild response. Certain movements and positions increase pain. Feels best at rest. No fevers, joint pain or swelling. No extremity weakness, or paresthesias. No h/o back pain.   Panic: we increased lexapro in October: she reports she is much improved.   Assessment  1. Strain of thoracic back region   2. Medial epicondylitis of left elbow   3. Panic disorder      Plan   Strain of thoracic back and overuse epicondylitis elbow:  Education given. toradol IM injection given in office with some improvement in pain. Recommend rest,ICE to elbow, heat to back and start nsaids and mm relaxer for 1-2 weeks. F/u if not improving.  Panic d/o is improved. Continue lexapro 20.   rec cpe.  Follow up: rec cpe. Overdue.  Visit date not found  No orders of the defined types were placed in this encounter.  Meds ordered this encounter  Medications  . diclofenac (VOLTAREN) 75 MG EC tablet    Sig: Take 1 tablet (75 mg total) by mouth 2 (two) times daily as needed.    Dispense:  60 tablet    Refill:  1  . cyclobenzaprine (FLEXERIL) 10 MG tablet    Sig: Take 1 tablet (10 mg total) by mouth at bedtime as needed for muscle spasms.    Dispense:  30 tablet    Refill:  0  . ketorolac (TORADOL) injection 60 mg      I reviewed the patients updated PMH, FH, and SocHx.    Patient Active Problem List   Diagnosis Date Noted  . Adult acne 09/22/2019  . Panic disorder  10/03/2018  . Dependence on nicotine from cigarettes 05/17/2018  . History of abnormal cervical Pap smear 05/17/2018  . Irritable bowel syndrome (IBS) 05/17/2018  . Encounter for Depo-Provera contraception 01/13/2018  . Allergic rhinitis 04/02/2014   Current Meds  Medication Sig  . ALPRAZolam (XANAX) 0.25 MG tablet Take 1 tablet (0.25 mg total) by mouth daily as needed for anxiety (panic attack).  . clindamycin (CLEOCIN-T) 1 % external solution Apply topically 2 (two) times daily.  . diclofenac (VOLTAREN) 75 MG EC tablet Take 1 tablet (75 mg total) by mouth 2 (two) times daily as needed.  Marland Kitchen escitalopram (LEXAPRO) 20 MG tablet Take 1 tablet (20 mg total) by mouth daily.  . [DISCONTINUED] diclofenac (VOLTAREN) 75 MG EC tablet Take 1 tablet (75 mg total) by mouth as needed.   Current Facility-Administered Medications for the 01/26/20 encounter (Office Visit) with Leamon Arnt, MD  Medication  . ketorolac (TORADOL) injection 60 mg  . medroxyPROGESTERone (DEPO-PROVERA) injection 150 mg    Allergies: Patient is allergic to prednisone and ciprofloxacin hcl. Family History: Patient family history includes Alcohol abuse in her maternal grandfather and paternal grandfather; COPD in her paternal grandmother; Healthy in her son; Hypertension in her father; Kidney disease in her mother; Learning disabilities in her mother.  Social History:  Patient  reports that she has been smoking cigarettes. She has a 24.00 pack-year smoking history. She has never used smokeless tobacco. She reports current alcohol use. She reports that she does not use drugs.  Review of Systems: Constitutional: Negative for fever malaise or anorexia Cardiovascular: negative for chest pain Respiratory: negative for SOB or persistent cough Gastrointestinal: negative for abdominal pain  Objective  Vitals: BP 118/64 (BP Location: Right Arm, Patient Position: Sitting, Cuff Size: Normal)   Pulse (!) 59   Temp 99.6 F (37.6 C)  (Temporal)   Ht 5\' 4"  (1.626 m)   Wt 103 lb 12.8 oz (47.1 kg)   SpO2 98%   BMI 17.82 kg/m  General: appears uncomfortable but moves to table easily,  A&Ox3 Neck: supple w/o cervical spine ttp, from Back: left trap and rhomboid and subscapular mms are tender w/o knots, no spinal ttp.  Left shoulder is normal except for decreased int rotation w/o abduction due to back pain Left medial epicondyle is ttp, no elbow swelling or warmth. FROM of elbow.     Commons side effects, risks, benefits, and alternatives for medications and treatment plan prescribed today were discussed, and the patient expressed understanding of the given instructions. Patient is instructed to call or message via MyChart if he/she has any questions or concerns regarding our treatment plan. No barriers to understanding were identified. We discussed Red Flag symptoms and signs in detail. Patient expressed understanding regarding what to do in case of urgent or emergency type symptoms.   Medication list was reconciled, printed and provided to the patient in AVS. Patient instructions and summary information was reviewed with the patient as documented in the AVS. This note was prepared with assistance of Dragon voice recognition software. Occasional wrong-word or sound-a-like substitutions may have occurred due to the inherent limitations of voice recognition software  This visit occurred during the SARS-CoV-2 public health emergency.  Safety protocols were in place, including screening questions prior to the visit, additional usage of staff PPE, and extensive cleaning of exam room while observing appropriate contact time as indicated for disinfecting solutions.

## 2020-01-26 NOTE — Addendum Note (Signed)
Addended byGracy Racer on: 01/26/2020 11:53 AM   Modules accepted: Orders

## 2020-02-23 ENCOUNTER — Encounter: Payer: Self-pay | Admitting: Physician Assistant

## 2020-02-23 ENCOUNTER — Ambulatory Visit (INDEPENDENT_AMBULATORY_CARE_PROVIDER_SITE_OTHER): Payer: 59 | Admitting: Physician Assistant

## 2020-02-23 DIAGNOSIS — R197 Diarrhea, unspecified: Secondary | ICD-10-CM

## 2020-02-23 DIAGNOSIS — R112 Nausea with vomiting, unspecified: Secondary | ICD-10-CM | POA: Diagnosis not present

## 2020-02-23 MED ORDER — ONDANSETRON HCL 4 MG PO TABS: 4.0000 mg | ORAL_TABLET | Freq: Three times a day (TID) | ORAL | 0 refills | Status: DC | PRN

## 2020-02-23 NOTE — Progress Notes (Signed)
Virtual Visit via Video   I connected with Kristine Holder on 02/23/20 at 12:30 PM EDT by a video enabled telemedicine application and verified that I am speaking with the correct person using two identifiers. Location patient: Home Location provider: [facility], Office Persons participating in the virtual visit: ALBERT DEVAUL, Jarold Motto PA-C  I discussed the limitations of evaluation and management by telemedicine and the availability of in person appointments. The patient expressed understanding and agreed to proceed.  Subjective:   HPI:   Nausea and diarrhea Yesterday when patient was at work all of a sudden she developed nausea and vomiting.  She had 4 episodes of vomiting at work and went home.  While she was at home she had ongoing queasiness.  She woke up this morning and is already had 4-5 episodes of diarrhea.  Denies blood in stools.  She is drinking plenty of fluids, denies lightheadedness or dizziness with standing.  Last night she ate crackers but she has not attempted to eat anything today.  She is also tried Tums and Pedialyte without improvement of symptoms.  She states that both her granddaughter and son had the same symptoms, and both lasted for about 24 to 48 hours.  Denies: fever, chest pain, SOB, severe abdominal pain  ROS: See pertinent positives and negatives per HPI.  Patient Active Problem List   Diagnosis Date Noted  . Adult acne 09/22/2019  . Panic disorder 10/03/2018  . Dependence on nicotine from cigarettes 05/17/2018  . History of abnormal cervical Pap smear 05/17/2018  . Irritable bowel syndrome (IBS) 05/17/2018  . Encounter for Depo-Provera contraception 01/13/2018  . Allergic rhinitis 04/02/2014    Social History   Tobacco Use  . Smoking status: Current Every Day Smoker    Packs/day: 1.00    Years: 24.00    Pack years: 24.00    Types: Cigarettes  . Smokeless tobacco: Never Used  . Tobacco comment: counseled verbally during  visits  Substance Use Topics  . Alcohol use: Yes    Current Outpatient Medications:  .  ALPRAZolam (XANAX) 0.25 MG tablet, Take 1 tablet (0.25 mg total) by mouth daily as needed for anxiety (panic attack)., Disp: 20 tablet, Rfl: 0 .  clindamycin (CLEOCIN-T) 1 % external solution, Apply topically 2 (two) times daily., Disp: 60 mL, Rfl: 3 .  cyclobenzaprine (FLEXERIL) 10 MG tablet, Take 1 tablet (10 mg total) by mouth at bedtime as needed for muscle spasms., Disp: 30 tablet, Rfl: 0 .  diclofenac (VOLTAREN) 75 MG EC tablet, Take 1 tablet (75 mg total) by mouth 2 (two) times daily as needed., Disp: 60 tablet, Rfl: 1 .  escitalopram (LEXAPRO) 20 MG tablet, Take 1 tablet (20 mg total) by mouth daily., Disp: 90 tablet, Rfl: 3 .  ondansetron (ZOFRAN) 4 MG tablet, Take 1 tablet (4 mg total) by mouth every 8 (eight) hours as needed for nausea or vomiting., Disp: 20 tablet, Rfl: 0  Current Facility-Administered Medications:  .  medroxyPROGESTERone (DEPO-PROVERA) injection 150 mg, 150 mg, Intramuscular, Once, Willow Ora, MD  Allergies  Allergen Reactions  . Prednisone Other (See Comments)    Makes patient feel loopy  . Ciprofloxacin Hcl Nausea And Vomiting    Objective:   VITALS: Per patient if applicable, see vitals. GENERAL: Alert, appears well and in no acute distress. HEENT: Atraumatic, conjunctiva clear, no obvious abnormalities on inspection of external nose and ears. NECK: Normal movements of the head and neck. CARDIOPULMONARY: No increased WOB. Speaking  in clear sentences. I:E ratio WNL.  MS: Moves all visible extremities without noticeable abnormality. PSYCH: Pleasant and cooperative, well-groomed. Speech normal rate and rhythm. Affect is appropriate. Insight and judgement are appropriate. Attention is focused, linear, and appropriate.  NEURO: CN grossly intact. Oriented as arrived to appointment on time with no prompting. Moves both UE equally.  SKIN: No obvious lesions, wounds,  erythema, or cyanosis noted on face or hands.  Assessment and Plan:   Diagnoses and all orders for this visit:  Nausea and vomiting, intractability of vomiting not specified, unspecified vomiting type  Diarrhea of presumed infectious origin  Other orders -     ondansetron (ZOFRAN) 4 MG tablet; Take 1 tablet (4 mg total) by mouth every 8 (eight) hours as needed for nausea or vomiting.   No red flags on discussion.  We are going to trial Zofran as needed for nausea.  I recommend that she continue to push fluids, such as diluted Gatorade.  I recommended trying to add in bland foods as able.  We extensively reviewed worsening precautions that would warrant ER evaluation for IV fluids including but not limited to, dizziness with standing, lightheadedness, syncope, inability to keep po's down.  Patient verbalized understanding and was in agreement to plan.  . Reviewed expectations re: course of current medical issues. . Discussed self-management of symptoms. . Outlined signs and symptoms indicating need for more acute intervention. . Patient verbalized understanding and all questions were answered. Marland Kitchen Health Maintenance issues including appropriate healthy diet, exercise, and smoking avoidance were discussed with patient. . See orders for this visit as documented in the electronic medical record.  I discussed the assessment and treatment plan with the patient. The patient was provided an opportunity to ask questions and all were answered. The patient agreed with the plan and demonstrated an understanding of the instructions.   The patient was advised to call back or seek an in-person evaluation if the symptoms worsen or if the condition fails to improve as anticipated.   CMA or LPN served as scribe during this visit. History, Physical, and Plan performed by medical provider. The above documentation has been reviewed and is accurate and complete.   [place], [place] 02/23/2020

## 2020-03-04 ENCOUNTER — Ambulatory Visit (INDEPENDENT_AMBULATORY_CARE_PROVIDER_SITE_OTHER): Payer: 59

## 2020-03-04 ENCOUNTER — Encounter: Payer: Self-pay | Admitting: Family Medicine

## 2020-03-04 ENCOUNTER — Other Ambulatory Visit: Payer: Self-pay

## 2020-03-04 ENCOUNTER — Ambulatory Visit: Payer: 59 | Admitting: Family Medicine

## 2020-03-04 VITALS — BP 108/62 | HR 61 | Temp 98.2°F | Ht 64.0 in | Wt 107.2 lb

## 2020-03-04 DIAGNOSIS — S99922A Unspecified injury of left foot, initial encounter: Secondary | ICD-10-CM

## 2020-03-04 DIAGNOSIS — S92512A Displaced fracture of proximal phalanx of left lesser toe(s), initial encounter for closed fracture: Secondary | ICD-10-CM | POA: Diagnosis not present

## 2020-03-04 MED ORDER — TRAMADOL HCL 50 MG PO TABS: 50.0000 mg | ORAL_TABLET | Freq: Three times a day (TID) | ORAL | 0 refills | Status: AC | PRN

## 2020-03-04 NOTE — Progress Notes (Signed)
   Kristine Holder is a 44 y.o. female who presents today for an office visit.  Assessment/Plan:  Toe fracture We will await radiology read though appears to have mildly displaced proximal phalanx fracture of left fourth toe.  Will place in postop shoe today and buddy tape.  Recommended she start diclofenac 75 mg twice daily.  Also send in a few tramadol to use as needed for breakthrough pain.  Given complex appearance of fracture, will place referral to orthopedics as well.  Discussed reasons return to care.  Low back pain No red flags.  Likely due to over compensation due to recent toe fracture.  Should have some improvement with above management.    Subjective:  HPI:  Patient injured her left foot about a week ago.  She was at home chasing her granddaughter on her left foot got caught up in the floor.  She had immediate pain and swelling to her left foot.  Symptoms have worsened over the last week.  She had bruising to the area as well.  Worse with motions and weightbearing.  Tried taking Advil with modest improvement.  Pain is now constant and throbbing.  The last day she is also started having some pain in her lower back.       Objective:  Physical Exam: Temp 98.2 F (36.8 C)   Wt 107 lb 3.2 oz (48.6 kg)   BMI 18.40 kg/m   Gen: No acute distress, resting comfortably MSK:-Left foot: Diffuse ecchymosis noted.  Tender to palpation along entire forefoot, most prominently at proximal phalanx of the fourth digit.  Neurovascular intact distally.     Kristine Holder. Kristine Ralph, MD 03/04/2020 1:08 PM

## 2020-03-04 NOTE — Patient Instructions (Signed)
It was very nice to see you today!  You have a fracture in your left fourth toe.  Please use the buddy tape as much as possible.  Please use the rigid sole shoe as much as possible.  Please take diclofenac 75 mg twice daily for the next couple weeks.  You can also use Flexeril 10 mg 3 times daily as needed for your low back pain.  I will send in tramadol.  You can use this as needed 3 times daily if your pain is uncontrolled.  We will place a referral for you to see the orthopedic surgeon to make sure the fracture heals properly.  Please let me know or let Dr. Mardelle Matte know if your symptoms are worsening.  Take care, Dr Jimmey Ralph  Please try these tips to maintain a healthy lifestyle:   Eat at least 3 REAL meals and 1-2 snacks per day.  Aim for no more than 5 hours between eating.  If you eat breakfast, please do so within one hour of getting up.    Each meal should contain half fruits/vegetables, one quarter protein, and one quarter carbs (no bigger than a computer mouse)   Cut down on sweet beverages. This includes juice, soda, and sweet tea.     Drink at least 1 glass of water with each meal and aim for at least 8 glasses per day   Exercise at least 150 minutes every week.

## 2020-03-05 ENCOUNTER — Telehealth: Payer: Self-pay

## 2020-03-05 NOTE — Telephone Encounter (Signed)
Diane from [facility] calling to provide report of foot X-ray. Best contact number [phone].

## 2020-03-05 NOTE — Telephone Encounter (Signed)
Dr.Parker reviewed report

## 2020-03-07 ENCOUNTER — Other Ambulatory Visit: Payer: Self-pay

## 2020-03-07 ENCOUNTER — Ambulatory Visit: Payer: 59 | Admitting: Orthopaedic Surgery

## 2020-03-07 ENCOUNTER — Encounter: Payer: Self-pay | Admitting: Physician Assistant

## 2020-03-07 DIAGNOSIS — S92502A Displaced unspecified fracture of left lesser toe(s), initial encounter for closed fracture: Secondary | ICD-10-CM | POA: Diagnosis not present

## 2020-03-07 NOTE — Progress Notes (Signed)
Office Visit Note   Patient: Kristine Holder           Date of Birth: Dec 04, 1976           MRN: [id number] Visit Date: 03/07/2020              Requested by: Ardith Dark, MD [address] [place],  [place] [address] PCP: Willow Ora, MD   Assessment & Plan: Visit Diagnoses:  1. Closed fracture of phalanx of left fourth toe, initial encounter     Plan: Impression is left foot fourth proximal phalanx fracture.  This should be amenable to nonoperative treatment.  She will remain weightbearing as tolerated in a postoperative shoe.  She will continue to buddy tape the third and fourth toes.  She will ice and elevate for pain and swelling.  I have agreed to write 1 prescription for Norco that would likely not be refilled.  She will follow-up with [place] in 2 weeks time for repeat evaluation three-view x-rays of the left foot.  Follow-Up Instructions: Return in about 2 weeks (around 03/21/2020).   Orders:  No orders of the defined types were placed in this encounter.  No orders of the defined types were placed in this encounter.     Procedures: No procedures performed   Clinical Data: No additional findings.   Subjective: Chief Complaint  Patient presents with  . Left Foot - Pain    HPI is a pleasant 44 year old female who comes in today for 1/2 to 2 weeks out from an injury to her left foot fourth toe.  She was chasing around her grandchild when she stubbed her toe.  She thought everything was okay until a few days later when she noticed increased pain, swelling and ecchymosis.  She was seen in the ED on 03/04/2020 where x-rays were obtained.  X-rays revealed a fourth proximal phalanx fracture.  She was placed in a postoperative shoe weightbearing as tolerated.  She comes in today for further evaluation treatment recommendation.  The pain she has had to the fourth great toe which is exacerbated when she is working long hours on her feet at the [employer].  She has been taking  an occasional tramadol and Tylenol without relief of symptoms.  Review of Systems as detailed in HPI.  All others reviewed and are negative.   Objective: Vital Signs: There were no vitals taken for this visit.  Physical Exam well-developed well-nourished female no acute distress.  Alert oriented x3.  Ortho Exam examination of her left foot reveals mild swelling.  Moderate tenderness at the fracture site.  Mild ecchymosis.  She is neurovascular intact distally.  Specialty Comments:  No specialty comments available.  Imaging: No new imaging   PMFS History: Patient Active Problem List   Diagnosis Date Noted  . Adult acne 09/22/2019  . Panic disorder 10/03/2018  . Dependence on nicotine from cigarettes 05/17/2018  . History of abnormal cervical Pap smear 05/17/2018  . Irritable bowel syndrome (IBS) 05/17/2018  . Encounter for Depo-Provera contraception 01/13/2018  . Allergic rhinitis 04/02/2014   Past Medical History:  Diagnosis Date  . Dependence on nicotine from cigarettes 05/17/2018  . History of abnormal cervical Pap smear 05/17/2018   H/o LEEP 2008 with nl f/u until 2015: nl pap with + HR HPV - no follow up.  . Human papilloma virus (HPV) infection   . Irritable bowel syndrome (IBS) 05/17/2018  . Migraine     Family History  Problem Relation  Age of Onset  . Hypertension Father   . Kidney disease Mother   . Learning disabilities Mother   . Healthy Son   . Alcohol abuse Maternal Grandfather   . COPD Paternal Grandmother   . Alcohol abuse Paternal Grandfather     Past Surgical History:  Procedure Laterality Date  . LEEP  2008   Social History   Occupational History  . 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 Smoking status: Current Every Day Smoker    Packs/day: 1.00    Years: 24.00    Pack years: 24.00    Types: Cigarettes  . Smokeless tobacco: Never Used  . Tobacco comment: counseled verbally during visits  Substance and Sexual  Activity  . Alcohol use: Yes  . Drug use: Never  . Sexual activity: Yes    Birth control/protection: Injection

## 2020-03-12 ENCOUNTER — Other Ambulatory Visit: Payer: Self-pay | Admitting: Physician Assistant

## 2020-03-12 ENCOUNTER — Telehealth: Payer: Self-pay | Admitting: Orthopaedic Surgery

## 2020-03-12 MED ORDER — HYDROCODONE-ACETAMINOPHEN 5-325 MG PO TABS: 1.0000 | ORAL_TABLET | Freq: Every day | ORAL | 0 refills | Status: DC | PRN

## 2020-03-12 NOTE — Telephone Encounter (Signed)
I just sent in

## 2020-03-12 NOTE — Telephone Encounter (Signed)
Patient called,   She said that she was told a prescription for pain would be called in for her but she has yet to receive it.   Call back: [phone]

## 2020-03-17 ENCOUNTER — Other Ambulatory Visit: Payer: Self-pay | Admitting: Family Medicine

## 2020-03-19 ENCOUNTER — Other Ambulatory Visit: Payer: Self-pay

## 2020-03-19 ENCOUNTER — Ambulatory Visit (INDEPENDENT_AMBULATORY_CARE_PROVIDER_SITE_OTHER): Payer: 59

## 2020-03-19 DIAGNOSIS — Z3042 Encounter for surveillance of injectable contraceptive: Secondary | ICD-10-CM

## 2020-03-19 MED ORDER — MEDROXYPROGESTERONE ACETATE 150 MG/ML IM SUSP
150.0000 mg | Freq: Once | INTRAMUSCULAR | Status: AC
  Administered 2020-03-19: 150 mg via INTRAMUSCULAR

## 2020-03-19 NOTE — Progress Notes (Signed)
Kristine Holder 44 y.o. female presents to office today for her 3 month DEPO-PROVERA shot per Asencion Partridge, MD. Administered MEDROXYPROGESTERONE ACETATE 150 mg IM left ventrogluteal. Patient tolerated well. Aware to return in 3 months for next shot.

## 2020-03-20 ENCOUNTER — Ambulatory Visit: Payer: Self-pay

## 2020-03-20 ENCOUNTER — Encounter: Payer: Self-pay | Admitting: Orthopaedic Surgery

## 2020-03-20 ENCOUNTER — Ambulatory Visit: Payer: 59 | Admitting: Orthopaedic Surgery

## 2020-03-20 DIAGNOSIS — S92502A Displaced unspecified fracture of left lesser toe(s), initial encounter for closed fracture: Secondary | ICD-10-CM

## 2020-03-20 NOTE — Progress Notes (Signed)
Office Visit Note   Patient: Kristine Holder           Date of Birth: 1976-09-13           MRN: [id number] Visit Date: 03/20/2020              Requested by: Leamon Arnt, [place],  [place] [address] PCP: Leamon Arnt, MD   Assessment & Plan: Visit Diagnoses:  1. Closed fracture of phalanx of left fourth toe, initial encounter     Plan: Impression is approximately 3-1/2 to 4 weeks status post left foot fourth proximal phalanx fracture that is continuing to heal.  She will continue to wear her cam walker weightbearing as tolerated.  She will ice and elevate for pain and swelling.  She will follow-up in the next 2 to 3 weeks for repeat evaluation and 3 view x-rays of her left foot.  Call with concerns or questions in meantime.  We will continue her current work restrictions for another 3 weeks.  Follow-Up Instructions: Return in about 20 days (around 04/09/2020).   Orders:  Orders Placed This Encounter  Procedures  . XR Foot Complete Left   No orders of the defined types were placed in this encounter.     Procedures: No procedures performed   Clinical Data: No additional findings.   Subjective: Chief Complaint  Patient presents with  . 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  She has been doing well weightbearing in a Cam walker.  She still has mild to moderate pain at times especially at the end of the workday.  She has been working light duty which has seemed to somewhat help.     Objective: Vital Signs: There were no vitals taken for this visit.    Ortho Exam examination of her left foot reveals no swelling.  She does have moderate tenderness to the fracture site.  She is neurovascularly intact distally.  Specialty Comments:  No specialty comments available.  Imaging: XR Foot Complete  Left  Result Date: 03/20/2020 X-rays reveal stable fourth proximal phalanx fracture with bony consolidation    PMFS History: Patient Active Problem List   Diagnosis Date Noted  . Adult acne 09/22/2019  . Panic disorder 10/03/2018  . Dependence on nicotine from cigarettes 05/17/2018  . History of abnormal cervical Pap smear 05/17/2018  . Irritable bowel syndrome (IBS) 05/17/2018  . Encounter for Depo-Provera contraception 01/13/2018  . Allergic rhinitis 04/02/2014   Past Medical History:  Diagnosis Date  . Dependence on nicotine from cigarettes 05/17/2018  . History of abnormal cervical Pap smear 05/17/2018   H/o LEEP 2008 with nl f/u until 2015: nl pap with + HR HPV - no follow up.  . Human papilloma virus (HPV) infection   . Irritable bowel syndrome (IBS) 05/17/2018  . Migraine     Family History  Problem Relation Age of Onset  . Hypertension Father   . Kidney disease Mother   . Learning disabilities Mother   . Healthy Son   . Alcohol abuse Maternal Grandfather   . COPD Paternal Grandmother   . Alcohol abuse Paternal Grandfather     Past Surgical History:  Procedure Laterality Date  . LEEP  2008   Social History   Occupational History  . 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 Smoking status: Current Every Day Smoker    Packs/day: 1.00    Years: 24.00    Pack years: 24.00    Types: Cigarettes  . Smokeless tobacco: Never Used  . Tobacco comment: counseled verbally during visits  Substance and Sexual Activity  . Alcohol use: Yes  . Drug use: Never  . Sexual activity: Yes    Birth control/protection: Injection

## 2020-03-22 NOTE — Progress Notes (Signed)
Agree with documentation and careplan.  

## 2020-04-12 ENCOUNTER — Ambulatory Visit: Payer: Self-pay

## 2020-04-12 ENCOUNTER — Encounter: Payer: Self-pay | Admitting: Orthopaedic Surgery

## 2020-04-12 ENCOUNTER — Other Ambulatory Visit: Payer: Self-pay

## 2020-04-12 ENCOUNTER — Ambulatory Visit: Payer: 59 | Admitting: Orthopaedic Surgery

## 2020-04-12 VITALS — Ht 64.0 in | Wt 107.0 lb

## 2020-04-12 DIAGNOSIS — S92502A Displaced unspecified fracture of left lesser toe(s), initial encounter for closed fracture: Secondary | ICD-10-CM | POA: Diagnosis not present

## 2020-04-12 NOTE — Progress Notes (Signed)
Office Visit Note   Patient: Kristine Holder           Date of Birth: October 25, 1976           MRN: [id number] Visit Date: 04/12/2020              Requested by: Willow Ora, MD [address] [place],  [place] [address] PCP: Willow Ora, MD   Assessment & Plan: Visit Diagnoses:  1. Closed fracture of phalanx of left fourth toe, initial encounter     Plan: impression is clinically healed left foot fourth proximal phalanx fracture. [name]  Follow up with [place] as needed.   Follow-Up Instructions: Return if symptoms worsen or fail to improve.   Orders:  Orders Placed This Encounter  Procedures  . XR Foot Complete Left   No orders of the defined types were placed in this encounter.     Procedures: No procedures performed   Clinical Data: No additional findings.   Subjective: Chief Complaint  Patient presents with  . Left Foot - Follow-up    HPI patient is a pleasant 44 year old female who comes in today approximately 7 weeks out left foot fourth proximal phalanx fracture.  She has been doing well.  She has been wearing her cam walker weightbearing as tolerated.  She has tried ambulating without this without pain.       Objective: Vital Signs: Ht 5\' 4"  (1.626 m)   Wt 107 lb (48.5 kg)   BMI 18.37 kg/m     Ortho Exam Examination of her left foot reveals no tenderness to the fracture site.  Full range of motion.  She is neurovascular intact distally.  Specialty Comments:  No specialty comments available.  Imaging: XR Foot Complete Left  Result Date: 04/12/2020 X-rays demonstrate continued bony consolidation to the fracture    PMFS History: Patient Active Problem List   Diagnosis Date Noted  . Adult acne 09/22/2019  . Panic disorder 10/03/2018  . Dependence on nicotine from cigarettes 05/17/2018  . History of abnormal cervical Pap smear 05/17/2018  . Irritable bowel syndrome  (IBS) 05/17/2018  . Encounter for Depo-Provera contraception 01/13/2018  . Allergic rhinitis 04/02/2014   Past Medical History:  Diagnosis Date  . Dependence on nicotine from cigarettes 05/17/2018  . History of abnormal cervical Pap smear 05/17/2018   H/o LEEP 2008 with nl f/u until 2015: nl pap with + HR HPV - no follow up.  . Human papilloma virus (HPV) infection   . Irritable bowel syndrome (IBS) 05/17/2018  . Migraine     Family History  Problem Relation Age of Onset  . Hypertension Father   . Kidney disease Mother   . Learning disabilities Mother   . Healthy Son   . Alcohol abuse Maternal Grandfather   . COPD Paternal Grandmother   . Alcohol abuse Paternal Grandfather     Past Surgical History:  Procedure Laterality Date  . LEEP  2008   Social History   Occupational History  . 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 Smoking status: Current Every Day Smoker    Packs/day: 1.00    Years: 24.00    Pack years: 24.00    Types: Cigarettes  . Smokeless tobacco: Never Used  . Tobacco comment: counseled verbally during visits  Substance and Sexual Activity  . Alcohol use: Yes  . Drug use: Never  . Sexual  activity: Yes    Birth control/protection: Injection

## 2020-06-18 ENCOUNTER — Other Ambulatory Visit: Payer: Self-pay

## 2020-06-18 ENCOUNTER — Ambulatory Visit (INDEPENDENT_AMBULATORY_CARE_PROVIDER_SITE_OTHER): Payer: 59

## 2020-06-18 DIAGNOSIS — Z3042 Encounter for surveillance of injectable contraceptive: Secondary | ICD-10-CM | POA: Diagnosis not present

## 2020-06-18 MED ORDER — MEDROXYPROGESTERONE ACETATE 150 MG/ML IM SUSP
150.0000 mg | Freq: Once | INTRAMUSCULAR | Status: AC
  Administered 2020-06-18: 150 mg via INTRAMUSCULAR

## 2020-06-18 NOTE — Progress Notes (Signed)
Per orders of Dr. Mardelle Matte , injection of Medroxyprogesterone (Depo) given by Yolanda Manges in right ventrogluteal. Patient tolerated injection well. Patient will make appointment for 3 month.

## 2020-07-10 ENCOUNTER — Telehealth (INDEPENDENT_AMBULATORY_CARE_PROVIDER_SITE_OTHER): Payer: 59 | Admitting: Physician Assistant

## 2020-07-10 ENCOUNTER — Encounter: Payer: Self-pay | Admitting: Physician Assistant

## 2020-07-10 VITALS — Ht 64.0 in | Wt 105.0 lb

## 2020-07-10 DIAGNOSIS — F4323 Adjustment disorder with mixed anxiety and depressed mood: Secondary | ICD-10-CM

## 2020-07-10 DIAGNOSIS — F410 Panic disorder [episodic paroxysmal anxiety] without agoraphobia: Secondary | ICD-10-CM

## 2020-07-10 MED ORDER — ESCITALOPRAM OXALATE 20 MG PO TABS: 20.0000 mg | ORAL_TABLET | Freq: Every day | ORAL | 3 refills | Status: DC

## 2020-07-10 MED ORDER — ALPRAZOLAM 0.25 MG PO TABS: 0.2500 mg | ORAL_TABLET | Freq: Every day | ORAL | 0 refills | Status: DC | PRN
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
## 2020-08-05 ENCOUNTER — Other Ambulatory Visit: Payer: Self-pay | Admitting: Family Medicine

## 2020-08-05 ENCOUNTER — Telehealth: Payer: Self-pay

## 2020-08-05 MED ORDER — ESCITALOPRAM OXALATE 20 MG PO TABS: 20.0000 mg | ORAL_TABLET | Freq: Every day | ORAL | 0 refills | Status: DC

## 2020-08-05 NOTE — Telephone Encounter (Signed)
Pt orders Lexapro through [facility] and it has not showed up. Pt has been without the lexapro for almost a week. She is requesting a prescription of her [facility]ap[facility] just to get her through til the mail in order comes. Pt is suffering from severe headaches without the medication

## 2020-08-05 NOTE — Telephone Encounter (Signed)
Patient called in and stated that [facility] said she couldn't get the refill till November. Please advise if something else can be sent in since she having severe headaches.

## 2020-08-05 NOTE — Telephone Encounter (Signed)
Rx sent to local pharmacy.  

## 2020-08-06 NOTE — Telephone Encounter (Signed)
Forwarding to pcp

## 2020-08-06 NOTE — Telephone Encounter (Signed)
See below

## 2020-08-06 NOTE — Telephone Encounter (Signed)
Per DPR, left detailed message with information listed below for patient. Advised to call back with any questions or concerns.

## 2020-08-06 NOTE — Telephone Encounter (Signed)
Please see why she isn't able to get [facility]. Does she need a RX sent there? Or why can't be filled from [facility]. She needs it. thanks

## 2020-08-06 NOTE — Telephone Encounter (Signed)
Due to patient's insurance, chronic meds can only be filled twice per calendar year and then mail order service after. Patient has already used her two fills for the year. [facility] shipped Lexapro out of 07/31/20, patient should be receiving this week.

## 2020-09-17 ENCOUNTER — Other Ambulatory Visit: Payer: Self-pay

## 2020-09-17 ENCOUNTER — Ambulatory Visit (INDEPENDENT_AMBULATORY_CARE_PROVIDER_SITE_OTHER): Payer: 59

## 2020-09-17 DIAGNOSIS — Z30019 Encounter for initial prescription of contraceptives, unspecified: Secondary | ICD-10-CM | POA: Diagnosis not present

## 2020-09-17 MED ORDER — MEDROXYPROGESTERONE ACETATE 150 MG/ML IM SUSP
150.0000 mg | Freq: Once | INTRAMUSCULAR | Status: AC
  Administered 2020-09-17: 150 mg via INTRAMUSCULAR

## 2020-09-17 NOTE — Progress Notes (Signed)
Per orders of Dr. Mardelle Matte , injection of B-12 and Depo-Provera  given by Yolanda Manges in right hip. Patient tolerated injection well. Patient will make appointment for 3 month. 12/28-1/11.

## 2020-09-19 ENCOUNTER — Other Ambulatory Visit: Payer: Self-pay

## 2020-09-19 ENCOUNTER — Telehealth (INDEPENDENT_AMBULATORY_CARE_PROVIDER_SITE_OTHER): Payer: 59 | Admitting: Family Medicine

## 2020-09-19 ENCOUNTER — Other Ambulatory Visit: Payer: 59

## 2020-09-19 DIAGNOSIS — R519 Headache, unspecified: Secondary | ICD-10-CM

## 2020-09-19 DIAGNOSIS — R0981 Nasal congestion: Secondary | ICD-10-CM | POA: Diagnosis not present

## 2020-09-19 DIAGNOSIS — R062 Wheezing: Secondary | ICD-10-CM | POA: Diagnosis not present

## 2020-09-19 DIAGNOSIS — R059 Cough, unspecified: Secondary | ICD-10-CM | POA: Diagnosis not present

## 2020-09-19 DIAGNOSIS — Z20822 Contact with and (suspected) exposure to covid-19: Secondary | ICD-10-CM

## 2020-09-19 MED ORDER — AMOXICILLIN-POT CLAVULANATE 875-125 MG PO TABS: 1.0000 | ORAL_TABLET | Freq: Two times a day (BID) | ORAL | 0 refills | Status: DC

## 2020-09-19 MED ORDER — ALBUTEROL SULFATE HFA 108 (90 BASE) MCG/ACT IN AERS: 2.0000 | INHALATION_SPRAY | Freq: Four times a day (QID) | RESPIRATORY_TRACT | 0 refills | Status: DC | PRN

## 2020-09-19 MED ORDER — BENZONATATE 100 MG PO CAPS
100.0000 mg | ORAL_CAPSULE | Freq: Three times a day (TID) | ORAL | 0 refills | Status: DC | PRN
Start: 2020-09-19 — End: 2020-11-14

## 2020-09-19 NOTE — Patient Instructions (Addendum)
   ---------------------------------------------------------------------------------------------------------------------------      WORK SLIP:  Patient Kristine Holder,  1976-12-03, was seen for a medical visit today, 09/19/20 . Please excuse from work according to the [facility] guidelines for a COVID like illness. We advise 10 days minimum from the onset of symptoms (09/16/2020) PLUS 1 day of no fever and improved symptoms. Will defer to employer for a sooner return to work if COVID19 testing is negative and the symptoms have resolved. Advise following CDC guidelines.   Sincerely: E-signature: Dr. Kriste Basque, DO [facility] Ph: [phone]   ------------------------------------------------------------------------------------------------------------------------------   -stay home while sick, and if you have COVID19 please stay home for a full 10 days since the onset of symptoms PLUS one day of no fever and feeling better  - COVID19 testing information: [email or web address] OR [phone]  -I sent the medication(s) we discussed to your pharmacy: Meds ordered this encounter  Medications  . benzonatate (TESSALON PERLES) 100 MG capsule    Sig: Take 1 capsule (100 mg total) by mouth 3 (three) times daily as needed.    Dispense:  20 capsule    Refill:  0  . amoxicillin-clavulanate (AUGMENTIN) 875-125 MG tablet    Sig: Take 1 tablet by mouth 2 (two) times daily.    Dispense:  20 tablet    Refill:  0  . albuterol (PROAIR HFA) 108 (90 Base) MCG/ACT inhaler    Sig: Inhale 2 puffs into the lungs every 6 (six) hours as needed for wheezing or shortness of breath.    Dispense:  1 each    Refill:  0    -call number provided for outpatient COVID19 treatment center [phone]  -can use tylenol or aleve if needed for fevers, aches and pains per instructions  -can use nasal saline a few times per day if nasal congestion,  sometime a short course of Afrin nasal spray for 3 days can help as well  -stay hydrated, drink plenty of fluids and eat small healthy meals - avoid dairy  -can take 1000 IU Vit D3 and Vit C lozenges per instructions  -check out the CDC website for more information on home care, transmission and treatment for COVID19  -follow up with your doctor in 2-3 days unless improving and feeling better  I hope you are feeling better soon! Seek in-person care or a follow up telemedicine visit promptly if your symptoms worsen, new concerns arise or you are not improving as expected. Call 911 if severe symptoms.

## 2020-09-19 NOTE — Progress Notes (Signed)
Virtual Visit via Video Note  I connected with Kristine Holder  on 09/19/20 at 12:20 PM EDT by a video enabled telemedicine application and verified that I am speaking with the correct person using two identifiers.  Location patient: home, [place] Location provider:work or home office Persons participating in the virtual visit: patient, provider  I discussed the limitations of evaluation and management by telemedicine and the availability of in person appointments. The patient expressed understanding and agreed to proceed.   HPI:  Acute telemedicine visit for : -Onset: 3 days ago worsened, had chronic congestion with her allergies the last few weeks -Symptoms include: nasal congestion thick, sinus pain, cough,sore throat, sometimes chest discomfort when she coughs, mild wheeze -Denies: fevers, SOB, vomiting, diarrhea, loss of taste or smell, malaise, inability to get out of bed/eat/drink -Has tried: sudafed -Pertinent past medical history: see below, seasonal allergies, has used alb in the past when sick -Pertinent medication allergies:prednisone, cipro -COVID-19 vaccine status: pfizer, 2nd does in sept  ROS: See pertinent positives and negatives per HPI.  Past Medical History:  Diagnosis Date  . Dependence on nicotine from cigarettes 05/17/2018  . History of abnormal cervical Pap smear 05/17/2018   H/o LEEP 2008 with nl f/u until 2015: nl pap with + HR HPV - no follow up.  . Human papilloma virus (HPV) infection   . Irritable bowel syndrome (IBS) 05/17/2018  . Migraine     Past Surgical History:  Procedure Laterality Date  . LEEP  2008     Current Outpatient Medications:  .  albuterol (PROAIR HFA) 108 (90 Base) MCG/ACT inhaler, Inhale 2 puffs into the lungs every 6 (six) hours as needed for wheezing or shortness of breath., Disp: 1 each, Rfl: 0 .  ALPRAZolam (XANAX) 0.25 MG tablet, Take 1 tablet (0.25 mg total) by mouth daily as needed for anxiety (panic attack)., Disp: 30 tablet, Rfl: 0 .   amoxicillin-clavulanate (AUGMENTIN) 875-125 MG tablet, Take 1 tablet by mouth 2 (two) times daily., Disp: 20 tablet, Rfl: 0 .  benzonatate (TESSALON PERLES) 100 MG capsule, Take 1 capsule (100 mg total) by mouth 3 (three) times daily as needed., Disp: 20 capsule, Rfl: 0 .  clindamycin (CLEOCIN-T) 1 % external solution, Apply topically 2 (two) times daily., Disp: 60 mL, Rfl: 3 .  cyclobenzaprine (FLEXERIL) 10 MG tablet, TAKE 1 TABLET BY MOUTH AT BEDTIME AS NEEDED FOR MUSCLE SPASMS, Disp: 30 tablet, Rfl: 0 .  diclofenac (VOLTAREN) 75 MG EC tablet, TAKE 1 TABLET (75 MG TOTAL) BY MOUTH 2 (TWO) TIMES DAILY AS NEEDED., Disp: 60 tablet, Rfl: 1 .  escitalopram (LEXAPRO) 20 MG tablet, Take 1 tablet (20 mg total) by mouth daily., Disp: 30 tablet, Rfl: 0  Current Facility-Administered Medications:  .  medroxyPROGESTERone (DEPO-PROVERA) injection 150 mg, 150 mg, Intramuscular, Once, Willow Ora, MD  EXAM:  VITALS per patient if applicable:  GENERAL: alert, oriented, appears well and in no acute distress  HEENT: atraumatic, conjunttiva clear, no obvious abnormalities on inspection of external nose and ears  NECK: normal movements of the head and neck  LUNGS: on inspection no signs of respiratory distress, breathing rate appears normal, no obvious gross SOB, gasping or wheezing  CV: no obvious cyanosis  MS: moves all visible extremities without noticeable abnormality  PSYCH/NEURO: pleasant and cooperative, no obvious depression or anxiety, speech and thought processing grossly intact  ASSESSMENT AND PLAN:  Discussed the following assessment and plan:  Nasal sinus congestion  Facial discomfort  Cough  Wheezing  -  we discussed possible serious and likely etiologies, options for evaluation and workup, limitations of telemedicine visit vs in person visit, treatment, treatment risks and precautions. Pt prefers to treat via telemedicine empirically rather than in person at this moment.   Discussed the possibility of a sinusitis, given underlying congestion for several weeks, now with worsening and sinus pain.  Versus, a new acute viral upper respiratory infection or other.  We also discussed the possibility of breakthrough Covid case.  It seems she may have a little bronchitis with this given the wheezing.  She reports she has required albuterol in the past with respiratory illness.  Denies any underlying diagnosis of asthma or COPD.  She opted for empiric treatment with Augmentin 875 twice daily for 7 to 10 days for possible sinusitis, along with Tessalon for cough and albuterol every 4-6 hours as needed.  Discussed options for Covid testing, staying home while sick, potential complications and precautions.   Work/School slipped offered: provided in patient instructions    Advised to seek prompt follow up telemedicine visit or in person care if worsening, new symptoms arise, or if is not improving with treatment. Did let this patient know that I only do telemedicine on Tuesdays and Thursdays for [place]. Advised to schedule follow up visit with PCP or [facility] if any further questions or concerns to avoid delays in care.   I discussed the assessment and treatment plan with the patient. The patient was provided an opportunity to ask questions and all were answered. The patient agreed with the plan and demonstrated an understanding of the instructions.     Terressa Koyanagi, DO

## 2020-09-20 LAB — NOVEL CORONAVIRUS, NAA: SARS-CoV-2, NAA: NOT DETECTED

## 2020-09-20 LAB — SARS-COV-2, NAA 2 DAY TAT

## 2020-10-06 ENCOUNTER — Other Ambulatory Visit: Payer: Self-pay | Admitting: Family Medicine

## 2020-11-14 ENCOUNTER — Encounter (HOSPITAL_BASED_OUTPATIENT_CLINIC_OR_DEPARTMENT_OTHER): Payer: Self-pay | Admitting: *Deleted

## 2020-11-14 ENCOUNTER — Emergency Department (HOSPITAL_BASED_OUTPATIENT_CLINIC_OR_DEPARTMENT_OTHER)
Admission: EM | Admit: 2020-11-14 | Discharge: 2020-11-14 | Disposition: A | Discharge status: Home / Self Care | Payer: 59 | Attending: Emergency Medicine | Admitting: Emergency Medicine

## 2020-11-14 ENCOUNTER — Telehealth (INDEPENDENT_AMBULATORY_CARE_PROVIDER_SITE_OTHER): Payer: 59 | Admitting: Family Medicine

## 2020-11-14 ENCOUNTER — Emergency Department (HOSPITAL_BASED_OUTPATIENT_CLINIC_OR_DEPARTMENT_OTHER): Payer: 59

## 2020-11-14 ENCOUNTER — Other Ambulatory Visit: Payer: Self-pay

## 2020-11-14 ENCOUNTER — Encounter: Payer: Self-pay | Admitting: Family Medicine

## 2020-11-14 DIAGNOSIS — R059 Cough, unspecified: Secondary | ICD-10-CM | POA: Diagnosis not present

## 2020-11-14 DIAGNOSIS — R079 Chest pain, unspecified: Secondary | ICD-10-CM | POA: Diagnosis not present

## 2020-11-14 DIAGNOSIS — Z20822 Contact with and (suspected) exposure to covid-19: Secondary | ICD-10-CM | POA: Insufficient documentation

## 2020-11-14 DIAGNOSIS — F17210 Nicotine dependence, cigarettes, uncomplicated: Secondary | ICD-10-CM | POA: Insufficient documentation

## 2020-11-14 DIAGNOSIS — R0789 Other chest pain: Secondary | ICD-10-CM | POA: Insufficient documentation

## 2020-11-14 DIAGNOSIS — R002 Palpitations: Secondary | ICD-10-CM | POA: Insufficient documentation

## 2020-11-14 LAB — CBC
HCT: 40.3 % (ref 36.0–46.0)
Hemoglobin: 13.4 g/dL (ref 12.0–15.0)
MCH: 29.8 pg (ref 26.0–34.0)
MCHC: 33.3 g/dL (ref 30.0–36.0)
MCV: 89.6 fL (ref 80.0–100.0)
Platelets: 362 10*3/uL (ref 150–400)
RBC: 4.5 MIL/uL (ref 3.87–5.11)
RDW: 13.2 % (ref 11.5–15.5)
WBC: 7.5 10*3/uL (ref 4.0–10.5)
nRBC: 0 % (ref 0.0–0.2)

## 2020-11-14 LAB — RESP PANEL BY RT-PCR (FLU A&B, COVID) ARPGX2
Influenza A by PCR: NEGATIVE
Influenza B by PCR: NEGATIVE
SARS Coronavirus 2 by RT PCR: NEGATIVE

## 2020-11-14 LAB — TROPONIN I (HIGH SENSITIVITY): Troponin I (High Sensitivity): <2 ng/L (ref <18)

## 2020-11-14 LAB — BASIC METABOLIC PANEL
Anion gap: 7 (ref 5–15)
BUN: 5 mg/dL — ABNORMAL LOW (ref 6–20)
CO2: 24 mmol/L (ref 22–32)
Calcium: 9.4 mg/dL (ref 8.9–10.3)
Chloride: 106 mmol/L (ref 98–111)
Creatinine, Ser: 0.77 mg/dL (ref 0.44–1.00)
GFR, Estimated: >60 mL/min (ref >60)
Glucose, Bld: 102 mg/dL — ABNORMAL HIGH (ref 70–99)
Potassium: 4.4 mmol/L (ref 3.5–5.1)
Sodium: 137 mmol/L (ref 135–145)

## 2020-11-14 LAB — PREGNANCY, URINE: Preg Test, Ur: NEGATIVE

## 2020-11-14 MED ORDER — NAPROXEN SODIUM 220 MG PO TABS: 220.0000 mg | ORAL_TABLET | Freq: Two times a day (BID) | ORAL | 0 refills | Status: DC | PRN

## 2020-11-14 MED ORDER — OMEPRAZOLE 20 MG PO CPDR: 20.0000 mg | DELAYED_RELEASE_CAPSULE | Freq: Every day | ORAL | 0 refills | Status: DC

## 2020-11-14 NOTE — Progress Notes (Signed)
Virtual Visit via Telephone Note  I connected with Kristine Holder on 11/14/20 at  1:00 PM EST by telephone and verified that I am speaking with the correct person using two identifiers.   I discussed the limitations, risks, security and privacy concerns of performing an evaluation and management service by telephone and the availability of in person appointments. I also discussed with the patient that there may be a patient responsible charge related to this service. The patient expressed understanding and agreed to proceed.  Location patient: home, [place] Location provider: work or home office Participants present for the call: patient, provider Patient did not have a visit with me in the prior 7 days to address this/these issue(s).   History of Present Illness:  Acute telemedicine visit for chest pain: -Onset: acute onset yesterday at work -Symptoms include: she was walking when happened - acute onset severe pain in the L chest, improved with rest, but is hurting some again today -has chronic smoker's cough - but denies worse from baseline -Denies:fevers, shortness of breath, malaise, nasal congestion, acute resp illness, known injury    Observations/Objective: Patient sounds cheerful and well on the phone. I do not appreciate any SOB. Speech and thought processing are grossly intact. Patient reported vitals: She thinks is TTP in area of concern.  Assessment and Plan:  Chest pain, unspecified type  -we discussed possible serious and likely etiologies, options for evaluation and workup, limitations of telemedicine visit vs in person visit, treatment, treatment risks and precautions.  Given the concerning nature of her symptoms, advised higher level of care for in person evaluation.  Discussed options.  She agrees to go either to the [facility] med center or to an urgent care near her house.  She agrees to go right away via self transport.  Discussed while this may be a pulled muscle  and musculoskeletal, in person evaluation is necessary to rule out cardiac, pulmonary, infectious and other potential etiologies. Advised to seek prompt in person care if worsening, new symptoms arise, or if is not improving with treatment. Advised of options for inperson care in case PCP office not available. Did let the patient know that I only do telemedicine shifts for [facility] on Tuesdays and Thursdays and advised a follow up visit with PCP or at an [facility] if has further questions or concerns.   Follow Up Instructions:  I did not refer this patient for an OV with me in the next 24 hours for this/these issue(s).  I discussed the assessment and treatment plan with the patient. The patient was provided an opportunity to ask questions and all were answered. The patient agreed with the plan and demonstrated an understanding of the instructions.   I spent 18 minutes on this encounter.   Terressa Koyanagi, DO

## 2020-11-14 NOTE — ED Notes (Signed)
Pt on monitor and vitals cylcing

## 2020-11-14 NOTE — ED Triage Notes (Signed)
Chest pain while at work yesterday. Pain was sudden onset of sharpness that has continued til today. Pain increases with coughing. States she has a chronic smokers cough.

## 2020-11-14 NOTE — ED Provider Notes (Signed)
[facility] EMERGENCY DEPARTMENT Provider Note   CSN: [id number] Arrival date & time: 11/14/20  1405     History Chief Complaint  Patient presents with  . Chest Pain    Kristine Holder is a 44 y.o. female.  HPI Patient reports that she was at work last night and got a severe pain that radiated from about her epigastrium to her central and left upper chest she reports the pain was pretty severe all night long.  Needed hard for her to sleep.  She did not have palpitations, syncope or shortness of breath.  She reports she still has discomfort in the middle of her chest and on the left side of her chest.  It is worse if she pushes on it or coughs.  She reports she has had what she considers a "smoker's cough".  She cough sometimes productive of some sputum.  No associated symptoms of shortness of breath, fevers, chills.  No URI symptoms.  Patient has been immunized for Covid.  No lower extremity swelling or calf pain.  Patient does smoke.  No alcohol use.  Patient reports she does also suffer from panic attacks.  She reports Saturday at work, 5 days ago, she had 2 panic attacks.  She reports that she gets palpitations and feels short of breath and cannot talk when they happen.  She reports she is prescribed Xanax to take for this.  She took 1 after the 2nd panic attack and it seemed to help.  He did not have a recurrence of this all week.    Past Medical History:  Diagnosis Date  . Dependence on nicotine from cigarettes 05/17/2018  . History of abnormal cervical Pap smear 05/17/2018   H/o LEEP 2008 with nl f/u until 2015: nl pap with + HR HPV - no follow up.  . Human papilloma virus (HPV) infection   . Irritable bowel syndrome (IBS) 05/17/2018  . Migraine     Patient Active Problem List   Diagnosis Date Noted  . Adult acne 09/22/2019  . Panic disorder 10/03/2018  . Dependence on nicotine from cigarettes 05/17/2018  . History of abnormal cervical Pap smear 05/17/2018  .  Irritable bowel syndrome (IBS) 05/17/2018  . Encounter for Depo-Provera contraception 01/13/2018  . Allergic rhinitis 04/02/2014    Past Surgical History:  Procedure Laterality Date  . LEEP  2008     OB History   No obstetric history on file.     Family History  Problem Relation Age of Onset  . Hypertension Father   . Kidney disease Mother   . Learning disabilities Mother   . Healthy Son   . Alcohol abuse Maternal Grandfather   . COPD Paternal Grandmother   . Alcohol abuse Paternal Grandfather     Social History   Tobacco Use  . Smoking status: Current Every Day Smoker    Packs/day: 1.00    Years: 24.00    Pack years: 24.00    Types: Cigarettes  . Smokeless tobacco: Never Used  . Tobacco comment: counseled verbally during visits  Vaping Use  . Vaping Use: Never used  Substance Use Topics  . Alcohol use: Yes  . Drug use: Never    Home Medications Prior to Admission medications   Medication Sig Start Date End Date Taking? Authorizing Provider  albuterol (PROAIR HFA) 108 (90 Base) MCG/ACT inhaler Inhale 2 puffs into the lungs every 6 (six) hours as needed for wheezing or shortness of breath. 09/19/20  Yes Selena Batten,  Damita Lack, DO  ALPRAZolam (XANAX) 0.25 MG tablet Take 1 tablet (0.25 mg total) by mouth daily as needed for anxiety (panic attack). 07/10/20  Yes Jarold Motto, PA  clindamycin (CLEOCIN-T) 1 % external solution Apply topically 2 (two) times daily. 09/22/19  Yes Willow Ora, MD  cyclobenzaprine (FLEXERIL) 10 MG tablet TAKE 1 TABLET BY MOUTH AT BEDTIME AS NEEDED FOR MUSCLE SPASMS 08/07/20  Yes Willow Ora, MD  diclofenac (VOLTAREN) 75 MG EC tablet TAKE 1 TABLET (75 MG TOTAL) BY MOUTH 2 (TWO) TIMES DAILY AS NEEDED. 03/18/20  Yes Willow Ora, MD  escitalopram (LEXAPRO) 20 MG tablet Take 1 tablet (20 mg total) by mouth daily. Please call and get annual exam with Dr. Mardelle Matte scheduled for further refills [phone] 10/07/20  Yes Willow Ora, MD  naproxen  sodium (ALEVE) 220 MG tablet Take 1 tablet (220 mg total) by mouth 2 (two) times daily as needed. 11/14/20   Arby Barrette, MD  omeprazole (PRILOSEC) 20 MG capsule Take 1 capsule (20 mg total) by mouth daily. 11/14/20   Arby Barrette, MD    Allergies    Prednisone and Ciprofloxacin hcl  Review of Systems   Review of Systems 10 systems reviewed and negative except as per HPI Physical Exam Updated Vital Signs BP 138/81 (BP Location: Right Arm)   Pulse (!) 56   Temp 98.6 F (37 C) (Oral)   Resp 18   Ht 5' 3.5" (1.613 m)   Wt 49.9 kg   SpO2 100%   BMI 19.18 kg/m   Physical Exam Constitutional:      Appearance: She is well-developed and well-nourished.  HENT:     Head: Normocephalic and atraumatic.  Eyes:     Extraocular Movements: Extraocular movements intact and EOM normal.     Conjunctiva/sclera: Conjunctivae normal.  Cardiovascular:     Rate and Rhythm: Normal rate and regular rhythm.     Pulses: Intact distal pulses.     Heart sounds: Normal heart sounds.  Pulmonary:     Effort: Pulmonary effort is normal.     Breath sounds: Normal breath sounds.     Comments: Patient endorses pain to palpation over the left sternal border.  Also pain to palpation over the left, lateral ribs anterior axillary line.  No palpable abnormality.  No skin rashes.  Soft tissues normal. Chest:     Chest wall: Tenderness present.  Abdominal:     General: Bowel sounds are normal. There is no distension.     Palpations: Abdomen is soft.     Tenderness: There is abdominal tenderness.     Comments: Mild epigastric discomfort to palpation.  No guarding.  No mass.  Musculoskeletal:        General: No edema. Normal range of motion.     Cervical back: Neck supple.     Comments: Calves soft and nontender.  No peripheral edema.  Skin condition very good.  Skin:    General: Skin is warm, dry and intact.  Neurological:     Mental Status: She is alert and oriented to person, place, and time.      GCS: GCS eye subscore is 4. GCS verbal subscore is 5. GCS motor subscore is 6.     Coordination: Coordination normal.     Deep Tendon Reflexes: Strength normal.  Psychiatric:        Mood and Affect: Mood and affect and mood normal.     ED Results / Procedures / Treatments  Labs (all labs ordered are listed, but only abnormal results are displayed) Labs Reviewed  BASIC METABOLIC PANEL - Abnormal; Notable for the following components:      Result Value   Glucose, Bld 102 (*)    BUN 5 (*)    All other components within normal limits  RESP PANEL BY RT-PCR (FLU A&B, COVID) ARPGX2  CBC  PREGNANCY, URINE  TROPONIN I (HIGH SENSITIVITY)  TROPONIN I (HIGH SENSITIVITY)    EKG EKG Interpretation  Date/Time:  Thursday November 14 2020 14:14:31 EST Ventricular Rate:  75 PR Interval:  126 QRS Duration: 82 QT Interval:  392 QTC Calculation: 437 R Axis:   86 Text Interpretation: Normal sinus rhythm Nonspecific ST abnormality Abnormal ECG no acute ischemic appearance, no old comparison Confirmed by Arby Barrette [phone]) on 11/14/2020 4:01:41 PM   Radiology DG Chest 2 View  Result Date: 11/14/2020 CLINICAL DATA:  Chest pain EXAM: CHEST - 2 VIEW COMPARISON:  08/26/2018 and prior FINDINGS: No focal consolidation. No pneumothorax or pleural effusion. Cardiomediastinal silhouette is within normal limits. No acute osseous abnormality. IMPRESSION: No focal airspace disease. Electronically Signed   By: Stana Bunting M.D.   On: 11/14/2020 15:13    Procedures Procedures (including critical care time)  Medications Ordered in ED Medications - No data to display  ED Course  I have reviewed the triage vital signs and the nursing notes.  Pertinent labs & imaging results that were available during my care of the patient were reviewed by me and considered in my medical decision making (see chart for details).    MDM Rules/Calculators/A&P                          Patient clinically well  in appearance.  She had an episode of sudden onset of chest pain last night.  Pain was present all night long.  Vital signs are normal.  Patient has reproducible chest wall pain to palpation.  Describes symptoms of bronchitis for over a week.  Asked x-ray clear without any evidence of pneumonia.  100% oxygen saturation.  No tachycardia, tachypnea or lower extremity swelling to suggest pulmonary embolus.  Patient is fully immunized for Covid.  No typical Covid symptoms.  Will check empirically due to report of some increased cough over the past week.  At this time, highest suspicion for pleurisy or chest wall pain.  Symptoms also slightly suggestive of reflux with central pain persisting all night long.  Will empirically start omeprazole and instructions for GERD.  Patient is counseled on the use of NSAIDs which may exacerbate GERD.  At this time, by physical exam however seems more likely chest wall pain.  Patient is counseled on return precautions and close follow-up with PCP peer Final Clinical Impression(s) / ED Diagnoses Final diagnoses:  Atypical chest pain    Rx / DC Orders ED Discharge Orders         Ordered    omeprazole (PRILOSEC) 20 MG capsule  Daily        11/14/20 1623    naproxen sodium (ALEVE) 220 MG tablet  2 times daily PRN        11/14/20 1623           Arby Barrette, MD 11/14/20 [id number]

## 2020-11-14 NOTE — Discharge Instructions (Signed)
1.  Central chest pain can sometimes be caused by gastroesophageal reflux disease even though you do not have typical symptoms of "heartburn".  At this time, start taking Prilosec (omeprazole) daily.  Take this approximately 30 minutes before you eat anything in the morning. 2.  You have chest pain that is reproducible by pressing on your chest.  Often this is due to pain of the chest wall itself.  This type of pain often is improved by anti-inflammatory medications.  You are instructed to take Aleve twice daily for the next week.  Because Aleve can cause increased stomach discomfort and reflux, be sure to take the Prilosec as prescribed. 3.  Schedule recheck with your doctor in about a week. 4.  Return to the emergency department if you get changing, worsening or new concerning symptoms peer

## 2020-12-12 ENCOUNTER — Ambulatory Visit (INDEPENDENT_AMBULATORY_CARE_PROVIDER_SITE_OTHER): Payer: 59

## 2020-12-12 ENCOUNTER — Other Ambulatory Visit: Payer: Self-pay

## 2020-12-12 DIAGNOSIS — Z3042 Encounter for surveillance of injectable contraceptive: Secondary | ICD-10-CM | POA: Diagnosis not present

## 2020-12-12 DIAGNOSIS — Z1152 Encounter for screening for COVID-19: Secondary | ICD-10-CM

## 2020-12-12 MED ORDER — MEDROXYPROGESTERONE ACETATE 150 MG/ML IM SUSP
150.0000 mg | Freq: Once | INTRAMUSCULAR | Status: AC
  Administered 2020-12-12: 150 mg via INTRAMUSCULAR

## 2020-12-12 NOTE — Progress Notes (Signed)
Kristine Holder 45 yr old female presents to office today for 3 month depo encounter per Asencion Partridge, MD. Administered Medroxyprogesterone 150 mg/mL IM left buttocks. Patient tolerated well.

## 2020-12-13 ENCOUNTER — Encounter: Payer: Self-pay | Admitting: Family Medicine

## 2020-12-14 LAB — NOVEL CORONAVIRUS, NAA

## 2021-02-01 ENCOUNTER — Encounter (HOSPITAL_BASED_OUTPATIENT_CLINIC_OR_DEPARTMENT_OTHER): Payer: Self-pay

## 2021-02-01 ENCOUNTER — Emergency Department (HOSPITAL_BASED_OUTPATIENT_CLINIC_OR_DEPARTMENT_OTHER)
Admission: EM | Admit: 2021-02-01 | Discharge: 2021-02-01 | Disposition: A | Discharge status: Home / Self Care | Payer: 59 | Attending: Emergency Medicine | Admitting: Emergency Medicine

## 2021-02-01 ENCOUNTER — Emergency Department (HOSPITAL_BASED_OUTPATIENT_CLINIC_OR_DEPARTMENT_OTHER): Payer: 59

## 2021-02-01 ENCOUNTER — Other Ambulatory Visit: Payer: Self-pay

## 2021-02-01 DIAGNOSIS — Z20822 Contact with and (suspected) exposure to covid-19: Secondary | ICD-10-CM | POA: Diagnosis not present

## 2021-02-01 DIAGNOSIS — R0789 Other chest pain: Secondary | ICD-10-CM

## 2021-02-01 DIAGNOSIS — F17210 Nicotine dependence, cigarettes, uncomplicated: Secondary | ICD-10-CM | POA: Insufficient documentation

## 2021-02-01 DIAGNOSIS — R059 Cough, unspecified: Secondary | ICD-10-CM

## 2021-02-01 LAB — COMPREHENSIVE METABOLIC PANEL
ALT: 12 U/L (ref 0–44)
AST: 19 U/L (ref 15–41)
Albumin: 4.4 g/dL (ref 3.5–5.0)
Alkaline Phosphatase: 43 U/L (ref 38–126)
Anion gap: 8 (ref 5–15)
BUN: 8 mg/dL (ref 6–20)
CO2: 22 mmol/L (ref 22–32)
Calcium: 9.3 mg/dL (ref 8.9–10.3)
Chloride: 106 mmol/L (ref 98–111)
Creatinine, Ser: 0.73 mg/dL (ref 0.44–1.00)
GFR, Estimated: >60 mL/min (ref >60)
Glucose, Bld: 88 mg/dL (ref 70–99)
Potassium: 4 mmol/L (ref 3.5–5.1)
Sodium: 136 mmol/L (ref 135–145)
Total Bilirubin: <0.1 mg/dL — ABNORMAL LOW (ref 0.3–1.2)
Total Protein: 7.3 g/dL (ref 6.5–8.1)

## 2021-02-01 LAB — CBC WITH DIFFERENTIAL/PLATELET
Abs Immature Granulocytes: 0.03 10*3/uL (ref 0.00–0.07)
Basophils Absolute: 0.1 10*3/uL (ref 0.0–0.1)
Basophils Relative: 1 %
Eosinophils Absolute: 0.2 10*3/uL (ref 0.0–0.5)
Eosinophils Relative: 2 %
HCT: 40.1 % (ref 36.0–46.0)
Hemoglobin: 13.2 g/dL (ref 12.0–15.0)
Immature Granulocytes: 0 %
Lymphocytes Relative: 29 %
Lymphs Abs: 2.7 10*3/uL (ref 0.7–4.0)
MCH: 29.5 pg (ref 26.0–34.0)
MCHC: 32.9 g/dL (ref 30.0–36.0)
MCV: 89.5 fL (ref 80.0–100.0)
Monocytes Absolute: 0.6 10*3/uL (ref 0.1–1.0)
Monocytes Relative: 6 %
Neutro Abs: 5.8 10*3/uL (ref 1.7–7.7)
Neutrophils Relative %: 62 %
Platelets: 408 10*3/uL — ABNORMAL HIGH (ref 150–400)
RBC: 4.48 MIL/uL (ref 3.87–5.11)
RDW: 13.5 % (ref 11.5–15.5)
WBC: 9.4 10*3/uL (ref 4.0–10.5)
nRBC: 0 % (ref 0.0–0.2)

## 2021-02-01 LAB — SARS CORONAVIRUS 2 (TAT 6-24 HRS): SARS Coronavirus 2: NEGATIVE

## 2021-02-01 LAB — TROPONIN I (HIGH SENSITIVITY)
Troponin I (High Sensitivity): 3 ng/L (ref <18)
Troponin I (High Sensitivity): <2 ng/L (ref <18)

## 2021-02-01 MED ORDER — BENZONATATE 100 MG PO CAPS: 100.0000 mg | ORAL_CAPSULE | Freq: Three times a day (TID) | ORAL | 0 refills | Status: DC

## 2021-02-01 MED ORDER — METHOCARBAMOL 500 MG PO TABS: 500.0000 mg | ORAL_TABLET | Freq: Two times a day (BID) | ORAL | 0 refills | Status: DC

## 2021-02-01 MED ORDER — LIDOCAINE 5 % EX PTCH: 1.0000 | MEDICATED_PATCH | CUTANEOUS | 0 refills | Status: DC

## 2021-02-01 MED ORDER — KETOROLAC TROMETHAMINE 30 MG/ML IJ SOLN
15.0000 mg | Freq: Once | INTRAMUSCULAR | Status: AC
  Administered 2021-02-01: 15 mg via INTRAVENOUS
  Filled 2021-02-01: qty 1

## 2021-02-01 NOTE — ED Triage Notes (Addendum)
Cough, chest congestion and chest pain  x 1 week.  Patient is a smoker and reports chronic cough but "this is worse than normal".  Patient has had negative home covid test, but her mother is positive and started having symptoms last Saturday, they don't live together but in the same building and have had limited contact.

## 2021-02-01 NOTE — ED Provider Notes (Signed)
[facility] EMERGENCY DEPARTMENT Provider Note   CSN: [id number] Arrival date & time: 02/01/21  5284     History Chief Complaint  Patient presents with  . Cough  . Chest Pain    Kristine Holder is a 45 y.o. female.  HPI  HPI: A 45 year old patient presents for evaluation of chest pain. Initial onset of pain was more than 6 hours ago. The patient's chest pain is described as heaviness/pressure/tightness and is not worse with exertion. The patient's chest pain is middle- or left-sided, is not well-localized, is not sharp and does not radiate to the arms/jaw/neck. The patient does not complain of nausea and denies diaphoresis. The patient has smoked in the past 90 days. The patient has no history of stroke, has no history of peripheral artery disease, denies any history of treated diabetes, has no relevant family history of coronary artery disease (first degree relative at less than age 46), is not hypertensive, has no history of hypercholesterolemia and does not have an elevated BMI (>=30).    Kristine Holder is a 45 y.o. female, with a history of tobacco use, presenting to the ED with increased cough beginning February 20. She states she typically has an underlying cough due to smoking cigarettes, however, this cough is worse than her typical cough. She also endorses some chest discomfort beginning yesterday morning upon waking.  She describes it as a pressure in the central and lower chest, radiating through the ribs bilaterally, constant since onset, 7/10.  No change with exertion.  She states her mother was diagnosed with Covid.  Her mother lives in the same apartment building, but not within the same apartment. Patient has had 2 Covid vaccination injections.  She states she is primarily here for Covid testing.  Covid test at home were negative and then inconclusive.  Denies history of PE/DVT, recent surgery, recent travel, recent trauma. Denies fever/chills, N/V/D,  abdominal pain, diaphoresis, dizziness, syncope, neurologic deficits, lower extremity edema/pain, shortness of breath, or any other complaints.  Past Medical History:  Diagnosis Date  . Dependence on nicotine from cigarettes 05/17/2018  . History of abnormal cervical Pap smear 05/17/2018   H/o LEEP 2008 with nl f/u until 2015: nl pap with + HR HPV - no follow up.  . Human papilloma virus (HPV) infection   . Irritable bowel syndrome (IBS) 05/17/2018  . Migraine     Patient Active Problem List   Diagnosis Date Noted  . Adult acne 09/22/2019  . Panic disorder 10/03/2018  . Dependence on nicotine from cigarettes 05/17/2018  . History of abnormal cervical Pap smear 05/17/2018  . Irritable bowel syndrome (IBS) 05/17/2018  . Encounter for Depo-Provera contraception 01/13/2018  . Allergic rhinitis 04/02/2014    Past Surgical History:  Procedure Laterality Date  . LEEP  2008     OB History   No obstetric history on file.     Family History  Problem Relation Age of Onset  . Hypertension Father   . Kidney disease Mother   . Learning disabilities Mother   . Healthy Son   . Alcohol abuse Maternal Grandfather   . COPD Paternal Grandmother   . Alcohol abuse Paternal Grandfather     Social History   Tobacco Use  . Smoking status: Current Every Day Smoker    Packs/day: 1.00    Years: 24.00    Pack years: 24.00    Types: Cigarettes  . Smokeless tobacco: Never Used  . Tobacco comment: counseled verbally during  visits  Vaping Use  . Vaping Use: Never used  Substance Use Topics  . Alcohol use: Yes  . Drug use: Never    Home Medications Prior to Admission medications   Medication Sig Start Date End Date Taking? Authorizing Provider  benzonatate (TESSALON) 100 MG capsule Take 1 capsule (100 mg total) by mouth every 8 (eight) hours. 02/01/21  Yes Leita Lindbloom C, PA-C  lidocaine (LIDODERM) 5 % Place 1 patch onto the skin daily. Remove & Discard patch within 12 hours or as directed  by MD 02/01/21  Yes Nevelyn Mellott C, PA-C  methocarbamol (ROBAXIN) 500 MG tablet Take 1 tablet (500 mg total) by mouth 2 (two) times daily. 02/01/21  Yes Shatina Streets C, PA-C  albuterol (PROAIR HFA) 108 (90 Base) MCG/ACT inhaler Inhale 2 puffs into the lungs every 6 (six) hours as needed for wheezing or shortness of breath. 09/19/20   Terressa KoyanagiKim, Hannah R, DO  ALPRAZolam Prudy Feeler(XANAX) 0.25 MG tablet Take 1 tablet (0.25 mg total) by mouth daily as needed for anxiety (panic attack). 07/10/20   Jarold MottoWorley, Samantha, PA  clindamycin (CLEOCIN-T) 1 % external solution Apply topically 2 (two) times daily. 09/22/19   [name], Camille L, MD  diclofenac (VOLTAREN) 75 MG EC tablet TAKE 1 TABLET (75 MG TOTAL) BY MOUTH 2 (TWO) TIMES DAILY AS NEEDED. 03/18/20   [name], Camille L, MD  escitalopram (LEXAPRO) 20 MG tablet Take 1 tablet (20 mg total) by mouth daily. Please call and get annual exam with Dr. Mardelle MatteAndy scheduled for further refills [phone] 10/07/20   [name], Camille L, MD  naproxen sodium (ALEVE) 220 MG tablet Take 1 tablet (220 mg total) by mouth 2 (two) times daily as needed. 11/14/20   Arby BarrettePfeiffer, Marcy, MD  omeprazole (PRILOSEC) 20 MG capsule Take 1 capsule (20 mg total) by mouth daily. 11/14/20   Arby BarrettePfeiffer, Marcy, MD    Allergies    Prednisone and Ciprofloxacin hcl  Review of Systems   Review of Systems  Constitutional: Negative for chills, diaphoresis and fever.  Respiratory: Positive for cough. Negative for shortness of breath.   Cardiovascular: Positive for chest pain. Negative for leg swelling.  Gastrointestinal: Negative for abdominal pain, diarrhea, nausea and vomiting.  Neurological: Negative for dizziness, syncope, weakness and numbness.  All other systems reviewed and are negative.   Physical Exam Updated Vital Signs BP (!) 143/78 (BP Location: Right Arm)   Pulse 62   Temp 98.2 F (36.8 C) (Oral)   Resp 15   Ht 5\' 4"  (1.626 m)   Wt 52.2 kg   SpO2 99%   BMI 19.74 kg/m   Physical Exam Vitals and nursing note  reviewed. Exam conducted with a chaperone present.  Constitutional:      General: She is not in acute distress.    Appearance: She is well-developed. She is not diaphoretic.  HENT:     Head: Normocephalic and atraumatic.     Mouth/Throat:     Mouth: Mucous membranes are moist.     Pharynx: Oropharynx is clear.  Eyes:     Conjunctiva/sclera: Conjunctivae normal.  Cardiovascular:     Rate and Rhythm: Normal rate and regular rhythm.     Pulses: Normal pulses.          Radial pulses are 2+ on the right side and 2+ on the left side.       Posterior tibial pulses are 2+ on the right side and 2+ on the left side.     Heart sounds: Normal  heart sounds.     Comments: Tactile temperature in the extremities appropriate and equal bilaterally. Pulmonary:     Effort: Pulmonary effort is normal. No respiratory distress.     Breath sounds: Normal breath sounds.     Comments: No increased work of breathing.  Speaks in full sentences without difficulty. Chest:     Chest wall: Tenderness present. No deformity or crepitus.     Comments: Patient has tenderness to the sternal costal margin as well as throughout the anterior ribs. Abdominal:     Palpations: Abdomen is soft.     Tenderness: There is no abdominal tenderness. There is no guarding.  Musculoskeletal:     Cervical back: Neck supple.     Right lower leg: No edema.     Left lower leg: No edema.  Lymphadenopathy:     Cervical: No cervical adenopathy.  Skin:    General: Skin is warm and dry.  Neurological:     Mental Status: She is alert.  Psychiatric:        Mood and Affect: Mood and affect normal.        Speech: Speech normal.        Behavior: Behavior normal.     ED Results / Procedures / Treatments   Labs (all labs ordered are listed, but only abnormal results are displayed) Labs Reviewed  CBC WITH DIFFERENTIAL/PLATELET - Abnormal; Notable for the following components:      Result Value   Platelets 408 (*)    All other  components within normal limits  COMPREHENSIVE METABOLIC PANEL - Abnormal; Notable for the following components:   Total Bilirubin <0.1 (*)    All other components within normal limits  SARS CORONAVIRUS 2 (TAT 6-24 HRS)  TROPONIN I (HIGH SENSITIVITY)  TROPONIN I (HIGH SENSITIVITY)    EKG EKG Interpretation  Date/Time:  Saturday February 01 2021 09:18:43 EST Ventricular Rate:  57 PR Interval:    QRS Duration: 77 QT Interval:  439 QTC Calculation: 428 R Axis:   83 Text Interpretation: Sinus rhythm RSR' in V1 or V2, probably normal variant agree, no sig change from previous. Confirmed by Arby Barrette [phone]) on 02/01/2021 2:59:19 PM   Radiology DG Chest Portable 1 View  Result Date: 02/01/2021 CLINICAL DATA:  45 year old with cough. EXAM: PORTABLE CHEST 1 VIEW COMPARISON:  11/14/2020 FINDINGS: Both lungs are clear. Negative for a pneumothorax. Heart and mediastinum are within normal limits. Trachea is midline. Bone structures are unremarkable. IMPRESSION: No acute cardiopulmonary disease. Electronically Signed   By: Richarda Overlie M.D.   On: 02/01/2021 10:55    Procedures Procedures   Medications Ordered in ED Medications  ketorolac (TORADOL) 30 MG/ML injection 15 mg (15 mg Intravenous Given 02/01/21 1327)    ED Course  I have reviewed the triage vital signs and the nursing notes.  Pertinent labs & imaging results that were available during my care of the patient were reviewed by me and considered in my medical decision making (see chart for details).  Clinical Course as of 02/01/21 1522  Sat Feb 01, 2021  1415 Patient voices improvement following the Toradol. [SJ]    Clinical Course User Index [SJ] Crandall Harvel C, PA-C   MDM Rules/Calculators/A&P HEAR Score: 2                        Patient is nontoxic appearing, afebrile, not tachycardic, not tachypneic, not hypotensive, maintains excellent SPO2 on room air, and is in no  apparent distress.  Patient's pain is quite  reproducible on palpation of her ribs. I have reviewed the patient's chart to obtain more information.   Low suspicion for ACS.  Not exertional pain without additional features.  HEART score is 2, indicating low risk for a cardiac event.  EKG without evidence of acute ischemia or pathologic/symptomatic arrhythmia.  Delta troponins negative. Wells criteria score is 0, indicating low risk for PE.  PERC negative. Dissection was considered, but thought less likely base on: History and description of the pain are not suggestive, patient is not ill-appearing, lack of risk factors, equal bilateral pulses, lack of neurologic deficits, no widened mediastinum on chest x-ray.  I reviewed and interpreted the patient's labs and radiological studies.  Vitals:   02/01/21 1300 02/01/21 1331 02/01/21 1400 02/01/21 1430  BP: 140/71 140/70 134/80 (!) 142/80  Pulse: (!) 59 65 (!) 56 68  Resp: 16 18 (!) 21 15  Temp:      TempSrc:      SpO2: 100% 100% 97% 98%  Weight:      Height:           Final Clinical Impression(s) / ED Diagnoses Final diagnoses:  Cough  Atypical chest pain    Rx / DC Orders ED Discharge Orders         Ordered    benzonatate (TESSALON) 100 MG capsule  Every 8 hours        02/01/21 1452    methocarbamol (ROBAXIN) 500 MG tablet  2 times daily        02/01/21 1452    lidocaine (LIDODERM) 5 %  Every 24 hours        02/01/21 1452           Anselm Pancoast, PA-C 02/01/21 1523    Arby Barrette, MD 02/02/21 [phone]

## 2021-02-01 NOTE — Discharge Instructions (Addendum)
Test Results for COVID-19 pending  You have a test pending for COVID-19.  Results typically return within about 48 hours.  Be sure to check MyChart for updated results.  We recommend isolating yourself until results are received.  Patients who have symptoms consistent with COVID-19 should self isolated for: At least 3 days (72 hours) have passed since recovery, defined as resolution of fever without the use of fever reducing medications and improvement in respiratory symptoms (e.g., cough, shortness of breath), and At least 7 days have passed since symptoms first appeared.  If you have no symptoms, but your test returns positive, recommend isolating for at least 10 days.   Hand washing: Wash your hands throughout the day, but especially before and after touching the face, using the restroom, sneezing, coughing, or touching surfaces that have been coughed or sneezed upon. Hydration: Symptoms of most illnesses will be intensified and complicated by dehydration. Dehydration can also extend the duration of symptoms. Drink plenty of fluids and get plenty of rest. You should be drinking at least half a liter of water an hour to stay hydrated. Electrolyte drinks (ex. Gatorade, Powerade, Pedialyte) are also encouraged. You should be drinking enough fluids to make your urine light yellow, almost clear. If this is not the case, you are not drinking enough water. Please note that some of the treatments indicated below will not be effective if you are not adequately hydrated. Pain or fever: Ibuprofen, Naproxen, or acetaminophen (generic for Tylenol) for pain or fever.  Antiinflammatory medications: Take 600 mg of ibuprofen every 6 hours or 440 mg (over the counter dose) to 500 mg (prescription dose) of naproxen every 12 hours for the next 3 days. After this time, these medications may be used as needed for pain. Take these medications with food to avoid upset stomach. Choose only one of these medications, do not  take them together. Acetaminophen (generic for Tylenol): Should you continue to have additional pain while taking the ibuprofen or naproxen, you may add in acetaminophen as needed. Your daily total maximum amount of acetaminophen from all sources should be limited to 4000mg /day for persons without liver problems, or 2000mg /day for those with liver problems. Cough: Use the benzonatate (generic for Tessalon) for cough.  Teas, warm liquids, broths, and honey can also help with cough. Zyrtec or Claritin: May add these medication daily to control underlying symptoms of congestion, sneezing, and other signs of allergies.  These medications are available over-the-counter. Generics: Cetirizine (generic for Zyrtec) and loratadine (generic for Claritin). Fluticasone: Use fluticasone (generic for Flonase), as directed, for nasal and sinus congestion.  This medication is available over-the-counter. Congestion: Plain guaifenesin (generic for plain Mucinex) may help relieve congestion. Saline sinus rinses and saline nasal sprays may also help relieve congestion. If you do not have high blood pressure, heart problems, or an allergy to such medications, you may also try phenylephrine or Sudafed. Sore throat: Warm liquids or Chloraseptic spray may help soothe a sore throat. Gargle twice a day with a salt water solution made from a half teaspoon of salt in a cup of warm water.  Follow up: Follow up with a primary care provider within the next two weeks should symptoms fail to resolve.  Follow-up with a cardiologist should chest discomfort continue. Return: Return to the ED for significantly worsening symptoms, shortness of breath, persistent vomiting, large amounts of blood in stool, or any other major concerns.  For prescription assistance, may try using prescription discount sites or apps, such as  goodrx.com

## 2021-02-05 ENCOUNTER — Encounter: Payer: Self-pay | Admitting: Physician Assistant

## 2021-02-05 ENCOUNTER — Telehealth: Payer: Self-pay

## 2021-02-05 ENCOUNTER — Other Ambulatory Visit: Payer: Self-pay

## 2021-02-05 ENCOUNTER — Ambulatory Visit (INDEPENDENT_AMBULATORY_CARE_PROVIDER_SITE_OTHER): Payer: 59 | Admitting: Physician Assistant

## 2021-02-05 VITALS — BP 110/70 | HR 83 | Temp 98.2°F | Ht 64.0 in | Wt 112.2 lb

## 2021-02-05 DIAGNOSIS — Z720 Tobacco use: Secondary | ICD-10-CM

## 2021-02-05 DIAGNOSIS — N644 Mastodynia: Secondary | ICD-10-CM | POA: Diagnosis not present

## 2021-02-05 DIAGNOSIS — R059 Cough, unspecified: Secondary | ICD-10-CM | POA: Diagnosis not present

## 2021-02-05 DIAGNOSIS — F410 Panic disorder [episodic paroxysmal anxiety] without agoraphobia: Secondary | ICD-10-CM

## 2021-02-05 MED ORDER — ESCITALOPRAM OXALATE 20 MG PO TABS: 20.0000 mg | ORAL_TABLET | Freq: Every day | ORAL | 0 refills | Status: DC

## 2021-02-05 MED ORDER — BUDESONIDE-FORMOTEROL FUMARATE 80-4.5 MCG/ACT IN AERO: 2.0000 | INHALATION_SPRAY | Freq: Two times a day (BID) | RESPIRATORY_TRACT | 12 refills | Status: DC

## 2021-02-05 MED ORDER — ALBUTEROL SULFATE HFA 108 (90 BASE) MCG/ACT IN AERS: 2.0000 | INHALATION_SPRAY | Freq: Four times a day (QID) | RESPIRATORY_TRACT | 6 refills | Status: DC | PRN

## 2021-02-05 NOTE — Telephone Encounter (Signed)
I suspect that no maintenance/steroid inhaler will be affordable without insurance. I recommend that she pick that up when she does get insurance.   Is she able to afford to the albuterol and lexapro?

## 2021-02-05 NOTE — Telephone Encounter (Signed)
Please see message. °

## 2021-02-05 NOTE — Patient Instructions (Signed)
It was great to see you!  For your cough: Symbicort inhaler in AM and in PM. Every day for 1 month. (If this is too expensive please let us know). Albuterol as needed. Work on the smoking. If no improvement of symptoms, let us know and we will get you to pulmonology.  For you anxiety I have refilled Lexapro for 90 days. Please schedule f/u with Dr. Mardelle Matte for further refills.  For your breast pain I have put in an order for your mammogram. Address: [address] [place], [place], [place] [address] Phone: [phone]  Take care,  Jarold Motto PA-C

## 2021-02-05 NOTE — Telephone Encounter (Signed)
Spoke to Kristine Holder told her per Lelon Mast, I suspect that no maintenance/steroid inhaler will be affordable without insurance. I recommend that she pick that up when she does get insurance. Kristine Holder verbalized understanding. Were you able  to afford to the albuterol and lexapro?  Kristine Holder said no it was too expensive. Told Kristine Holder she needs to go onto [facility] and find the medications she needs and the cheapest pharmacy and let me know and I will send Rx's there. Kristine Holder verbalized understanding.

## 2021-02-05 NOTE — Progress Notes (Signed)
Kristine Holder is a 45 y.o. female here for a follow up of a pre-existing problem.  I acted as a Neurosurgeon for [employer], PA-C Corky Mull, LPN   History of Present Illness:   Chief Complaint  Patient presents with  . Cough    HPI  Cough Pt c/o persistant dry non-productive cough x 2 weeks. Seen in ED on 2/26. Pt says her ribs hurt when she coughs. During ER visit she was given topical lidocaine patch, tessalon perles and a muscle relaxer. CXR was normal, blood work normal. She is an active smoker. Started smoking very young, currently smoking less than a PPD. Has tried to quit in the past without significant results -- used Chantix, patches, lozenges. Denies prior dx of asthma/emphysema.  Breast pain Over the past 1 week has been dealing with R upper breast and R axillary pain. She has recently switched jobs 1 month ago and is doing a lot more repetitive movement with her R arm that may be contributing. Denies palpable nodules, nipple discharge or changes to breast appearance. Has never had a mammo. Denies fam hx of breast cancer.  Anxiety Currently taking lexapro 20 mg daily. Has been without this for two weeks. Denies: SI/HI. Feels like this medication was significantly helping.   Past Medical History:  Diagnosis Date  . Dependence on nicotine from cigarettes 05/17/2018  . History of abnormal cervical Pap smear 05/17/2018   H/o LEEP 2008 with nl f/u until 2015: nl pap with + HR HPV - no follow up.  . Human papilloma virus (HPV) infection   . Irritable bowel syndrome (IBS) 05/17/2018  . Migraine      Social History   Tobacco Use  . Smoking status: Current Every Day Smoker    Packs/day: 1.00    Years: 24.00    Pack years: 24.00    Types: Cigarettes  . Smokeless tobacco: Never Used  . Tobacco comment: counseled verbally during visits  Vaping Use  . Vaping Use: Never used  Substance Use Topics  . Alcohol use: Yes  . Drug use: Never    Past Surgical History:   Procedure Laterality Date  . LEEP  2008    Family History  Problem Relation Age of Onset  . Hypertension Father   . Kidney disease Mother   . Learning disabilities Mother   . Healthy Son   . Alcohol abuse Maternal Grandfather   . COPD Paternal Grandmother   . Alcohol abuse Paternal Grandfather     Allergies  Allergen Reactions  . Prednisone Other (See Comments)    Makes patient feel loopy  . Ciprofloxacin Hcl Nausea And Vomiting    Current Medications:   Current Outpatient Medications:  .  ALPRAZolam (XANAX) 0.25 MG tablet, Take 1 tablet (0.25 mg total) by mouth daily as needed for anxiety (panic attack)., Disp: 30 tablet, Rfl: 0 .  benzonatate (TESSALON) 100 MG capsule, Take 1 capsule (100 mg total) by mouth every 8 (eight) hours., Disp: 21 capsule, Rfl: 0 .  budesonide-formoterol (SYMBICORT) 80-4.5 MCG/ACT inhaler, Inhale 2 puffs into the lungs in the morning and at bedtime., Disp: 1 each, Rfl: 12 .  clindamycin (CLEOCIN-T) 1 % external solution, Apply topically 2 (two) times daily., Disp: 60 mL, Rfl: 3 .  lidocaine (LIDODERM) 5 %, Place 1 patch onto the skin daily. Remove & Discard patch within 12 hours or as directed by MD, Disp: 30 patch, Rfl: 0 .  naproxen sodium (ALEVE) 220 MG tablet, Take 1  tablet (220 mg total) by mouth 2 (two) times daily as needed., Disp: 30 tablet, Rfl: 0 .  omeprazole (PRILOSEC) 20 MG capsule, Take 1 capsule (20 mg total) by mouth daily. (Patient taking differently: Take 20 mg by mouth as needed.), Disp: 30 capsule, Rfl: 0 .  albuterol (PROAIR HFA) 108 (90 Base) MCG/ACT inhaler, Inhale 2 puffs into the lungs every 6 (six) hours as needed for wheezing or shortness of breath., Disp: 1 each, Rfl: 6 .  escitalopram (LEXAPRO) 20 MG tablet, Take 1 tablet (20 mg total) by mouth daily., Disp: 90 tablet, Rfl: 0 .  methocarbamol (ROBAXIN) 500 MG tablet, Take 1 tablet (500 mg total) by mouth 2 (two) times daily. (Patient not taking: Reported on 02/05/2021), Disp:  20 tablet, Rfl: 0  Current Facility-Administered Medications:  .  medroxyPROGESTERone (DEPO-PROVERA) injection 150 mg, 150 mg, Intramuscular, Once, Willow Ora, MD   Review of Systems:   ROS  Negative unless otherwise specified per HPI.  Vitals:   Vitals:   02/05/21 1341  BP: 110/70  Pulse: 83  Temp: 98.2 F (36.8 C)  TempSrc: Temporal  SpO2: 96%  Weight: 112 lb 4 oz (50.9 kg)  Height: 5\' 4"  (1.626 m)     Body mass index is 19.27 kg/m.  Physical Exam:   Physical Exam Vitals and nursing note reviewed.  Constitutional:      General: She is not in acute distress.    Appearance: She is well-developed and well-nourished. She is not ill-appearing, toxic-appearing or sickly-appearing.  HENT:     Head: Normocephalic and atraumatic.  Cardiovascular:     Rate and Rhythm: Normal rate and regular rhythm.     Heart sounds: Normal heart sounds.  Pulmonary:     Effort: Pulmonary effort is normal. No accessory muscle usage or respiratory distress.     Breath sounds: Normal breath sounds.  Chest:     Comments: TTP to superior portion of R breast and R axilla; no obvious skin changes or nipple discharge  Skin:    General: Skin is warm, dry and intact.  Neurological:     Mental Status: She is alert.  Psychiatric:        Mood and Affect: Mood and affect normal.        Speech: Speech normal.        Behavior: Behavior is cooperative.     Assessment and Plan:   [name] was seen today for cough.  Diagnoses and all orders for this visit:  Breast pain, right No prior mammogram. Update mammogram, ordered today for patient. If normal mammogram, suspect muscle strain due to repetitive motion. Can use anti-inflammatories and muscle relaxers prn. Follow-up based on results and clinical response. -     MM Digital Diagnostic Bilat; Future  Cough Suspect likely underlying lung disease due to smoking hx. Start symbicort in AM and PM. Albuterol prn. Recommend referral to  pulmonary if symptoms worsen or persist.  Tobacco abuse Encouraged reduction.  Panic disorder Refill Lexapro 20 mg daily x 90 days. Follow-up with PCP for further refills.  Other orders -     albuterol (PROAIR HFA) 108 (90 Base) MCG/ACT inhaler; Inhale 2 puffs into the lungs every 6 (six) hours as needed for wheezing or shortness of breath. -     budesonide-formoterol (SYMBICORT) 80-4.5 MCG/ACT inhaler; Inhale 2 puffs into the lungs in the morning and at bedtime. -     escitalopram (LEXAPRO) 20 MG tablet; Take 1 tablet (20 mg total) by mouth  daily.  CMA or LPN served as scribe during this visit. History, Physical, and Plan performed by medical provider. The above documentation has been reviewed and is accurate and complete.   Jarold Motto, PA-C

## 2021-02-05 NOTE — Telephone Encounter (Signed)
Patient states she seen Lelon Mast earlier, her medications is going to cost her a lot of money, and just found out that she does not have insurance until a few more weeks.

## 2021-02-06 MED ORDER — ALBUTEROL SULFATE HFA 108 (90 BASE) MCG/ACT IN AERS: 2.0000 | INHALATION_SPRAY | Freq: Four times a day (QID) | RESPIRATORY_TRACT | 6 refills | Status: DC | PRN

## 2021-02-06 MED ORDER — ESCITALOPRAM OXALATE 20 MG PO TABS: 20.0000 mg | ORAL_TABLET | Freq: Every day | ORAL | 0 refills | Status: DC

## 2021-02-06 NOTE — Telephone Encounter (Signed)
Spoke to pt told her I will send Rx's to [facility]. The inhaler is called Albuterol. Pt said she could not look that one up. Told her I will look it up, the inhaler is $22.25. She said that is fine. Asked her if she was able to print out coupons? Pt said no. Told her I will print coupons out and put at the front desk for you ot pick up. Pt said is it okay someone picks up for me. Told her that is fine. I will send both Rx's now to [facility] for you. Pt verbalized understanding.

## 2021-02-06 NOTE — Telephone Encounter (Signed)
Patient is calling in requesting the prescription to go to [facility] -Address: [address] [place], [place], [place] [address]. Aerika also could not remember how Kristine Holder wanted her to word the inhaler for [employer]

## 2021-02-06 NOTE — Addendum Note (Signed)
Addended by: Jimmye Norman on: 02/06/2021 10:18 AM   Modules accepted: Orders

## 2021-02-19 ENCOUNTER — Other Ambulatory Visit: Payer: Self-pay | Admitting: Physician Assistant

## 2021-02-19 DIAGNOSIS — N644 Mastodynia: Secondary | ICD-10-CM

## 2021-02-20 ENCOUNTER — Inpatient Hospital Stay: Admission: RE | Admit: 2021-02-20 | Payer: 59 | Source: Ambulatory Visit

## 2021-03-04 ENCOUNTER — Telehealth: Payer: Self-pay

## 2021-03-04 NOTE — Telephone Encounter (Signed)
Please clarify question.... if she stops depo, the effects will wear off over time and she will resume her menstrual cycle. It is possible to get pregnant without birth control but more difficult given age.

## 2021-03-04 NOTE — Telephone Encounter (Signed)
Please advise 

## 2021-03-04 NOTE — Telephone Encounter (Signed)
LMOVM to return call.

## 2021-03-04 NOTE — Telephone Encounter (Signed)
Patient has question about her Depo shot. Patient wants to know what will happen if she stops it. Patient would like a call back at :[phone].

## 2021-03-11 NOTE — Telephone Encounter (Signed)
Patient never returned call so MyChart message sent to patient

## 2021-04-07 ENCOUNTER — Telehealth: Payer: Self-pay

## 2021-04-07 NOTE — Telephone Encounter (Signed)
Patient has been scheduled

## 2021-04-07 NOTE — Telephone Encounter (Signed)
I haven't seen her in the office since 01/2020. Rec scheduling cpe, but also OV for birth control discussion. Thanks.

## 2021-04-07 NOTE — Telephone Encounter (Signed)
OK to restart depo? Or offer appt to discuss other options

## 2021-04-07 NOTE — Telephone Encounter (Signed)
Please schedule patient for office visit and CPE

## 2021-04-07 NOTE — Telephone Encounter (Signed)
Patient stated she is having side effects from not having her Depo Shot. Paitent would like a CMA to call her back and figure out some options if she should get back on the Depo or not. Please advise.

## 2021-04-09 ENCOUNTER — Ambulatory Visit (INDEPENDENT_AMBULATORY_CARE_PROVIDER_SITE_OTHER): Payer: Self-pay | Admitting: Family Medicine

## 2021-04-09 ENCOUNTER — Encounter: Payer: Self-pay | Admitting: Family Medicine

## 2021-04-09 ENCOUNTER — Other Ambulatory Visit: Payer: Self-pay

## 2021-04-09 VITALS — BP 110/60 | HR 77 | Temp 98.3°F | Resp 15 | Ht 64.0 in | Wt 112.4 lb

## 2021-04-09 DIAGNOSIS — R112 Nausea with vomiting, unspecified: Secondary | ICD-10-CM

## 2021-04-09 DIAGNOSIS — Z3009 Encounter for other general counseling and advice on contraception: Secondary | ICD-10-CM

## 2021-04-09 DIAGNOSIS — R1084 Generalized abdominal pain: Secondary | ICD-10-CM

## 2021-04-09 DIAGNOSIS — R252 Cramp and spasm: Secondary | ICD-10-CM

## 2021-04-09 DIAGNOSIS — F410 Panic disorder [episodic paroxysmal anxiety] without agoraphobia: Secondary | ICD-10-CM

## 2021-04-09 DIAGNOSIS — K580 Irritable bowel syndrome with diarrhea: Secondary | ICD-10-CM

## 2021-04-09 LAB — COMPREHENSIVE METABOLIC PANEL WITH GFR
ALT: 7 U/L (ref 0–35)
AST: 13 U/L (ref 0–37)
Albumin: 4.5 g/dL (ref 3.5–5.2)
Alkaline Phosphatase: 49 U/L (ref 39–117)
BUN: 10 mg/dL (ref 6–23)
CO2: 29 meq/L (ref 19–32)
Calcium: 9.6 mg/dL (ref 8.4–10.5)
Chloride: 104 meq/L (ref 96–112)
Creatinine, Ser: 0.92 mg/dL (ref 0.40–1.20)
GFR: 75.18 mL/min
Glucose, Bld: 80 mg/dL (ref 70–99)
Potassium: 4.9 meq/L (ref 3.5–5.1)
Sodium: 138 meq/L (ref 135–145)
Total Bilirubin: 0.3 mg/dL (ref 0.2–1.2)
Total Protein: 6.8 g/dL (ref 6.0–8.3)

## 2021-04-09 LAB — LIPASE: Lipase: 36 U/L (ref 11.0–59.0)

## 2021-04-09 LAB — CBC WITH DIFFERENTIAL/PLATELET
Basophils Absolute: 0.1 10*3/uL (ref 0.0–0.1)
Basophils Relative: 1.2 % (ref 0.0–3.0)
Eosinophils Absolute: 0.2 10*3/uL (ref 0.0–0.7)
Eosinophils Relative: 2.5 % (ref 0.0–5.0)
HCT: 39.8 % (ref 36.0–46.0)
Hemoglobin: 13.4 g/dL (ref 12.0–15.0)
Lymphocytes Relative: 38 % (ref 12.0–46.0)
Lymphs Abs: 2.7 10*3/uL (ref 0.7–4.0)
MCHC: 33.6 g/dL (ref 30.0–36.0)
MCV: 88.9 fl (ref 78.0–100.0)
Monocytes Absolute: 0.5 10*3/uL (ref 0.1–1.0)
Monocytes Relative: 7.5 % (ref 3.0–12.0)
Neutro Abs: 3.6 10*3/uL (ref 1.4–7.7)
Neutrophils Relative %: 50.8 % (ref 43.0–77.0)
Platelets: 363 10*3/uL (ref 150.0–400.0)
RBC: 4.47 Mil/uL (ref 3.87–5.11)
RDW: 13 % (ref 11.5–15.5)
WBC: 7.2 10*3/uL (ref 4.0–10.5)

## 2021-04-09 LAB — FOLLICLE STIMULATING HORMONE: FSH: 7.1 m[IU]/mL

## 2021-04-09 LAB — TSH: TSH: 1.05 u[IU]/mL (ref 0.35–4.50)

## 2021-04-09 LAB — POCT URINE PREGNANCY: Preg Test, Ur: NEGATIVE

## 2021-04-09 MED ORDER — METHOCARBAMOL 500 MG PO TABS: 500.0000 mg | ORAL_TABLET | Freq: Two times a day (BID) | ORAL | 0 refills | Status: DC | PRN

## 2021-04-09 MED ORDER — PROMETHAZINE HCL 25 MG PO TABS: 25.0000 mg | ORAL_TABLET | Freq: Four times a day (QID) | ORAL | 0 refills | Status: DC | PRN

## 2021-04-09 NOTE — Progress Notes (Signed)
Subjective  CC:  Chief Complaint  Patient presents with  . Contraception    Missed her shot April 11th, 2 weeks after missing shot she started feeling off. Wants to discuss if she should stay on it or get off of it    HPI: Kristine Holder is a 45 y.o. female who presents to the office today to address the problems listed above in the chief complaint.  45 year old female who was on Depo-Provera for birth control complains of nausea, intermittent vomiting and increased stooling with abdominal cramping.  She wonders if this is related to stopping the Depo-Provera.  She is no longer in relationship and is not sexually active.  She does not need birth control.  Since stopping birth control about 3 and half weeks ago, the symptoms have worsened.  She has not yet had a menstrual cycle.  She has no pelvic pain or vaginal discharge.  No fevers or chills.  No history of diverticulitis.  She does carry a diagnosis of IBS.  She admits to drinking a lot of Coca-Cola recently.  She denies major stressors but is moving.  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  I reviewed recent ER records.  She did have an evaluation for atypical chest pain at December.  It was thought related to GERD.  Omeprazole was prescribed but she did not take it.  She does take NSAIDs for headaches.  Complains of leg spasms for which she has used Robaxin in the past.  Requests refills.  No calf pain or leg swelling.  Generalized anxiety disorder: Reports she is stable on the Lexapro.  Health maintenance: She is overdue for complete physical.   Assessment  1. Abdominal pain, generalized   2. Non-intractable vomiting with nausea, unspecified vomiting type   3. Irritable bowel syndrome with diarrhea   4. Birth control counseling   5. Leg cramps   6. Panic disorder      Plan   Abdominal pain: Likely active GERD/gastritis with active irritable bowel syndrome.  Check lab  work to rule out other causes.  Recommend omeprazole 20 mg daily, refrain from NSAIDs, stop drinking Coca-Cola daily and monitor.  Doubt related to stopping Depo-Provera.  Birth control counseling: Because she is not sexually active, feel comfortable stopping Depo-Provera.  Check FSH  Leg cramps: Robaxin refilled  Panic disorder stable on Lexapro  Follow up: Complete physical with Pap smear Visit date not found  Orders Placed This Encounter  Procedures  . Follicle stimulating hormone  . CBC with Differential/Platelet  . Comprehensive metabolic panel  . Lipase  . TSH  . POCT urine pregnancy   Meds ordered this encounter  Medications  . promethazine (PHENERGAN) 25 MG tablet    Sig: Take 1 tablet (25 mg total) by mouth every 6 (six) hours as needed for nausea or vomiting.    Dispense:  20 tablet    Refill:  0  . methocarbamol (ROBAXIN) 500 MG tablet    Sig: Take 1 tablet (500 mg total) by mouth 2 (two) times daily as needed for muscle spasms.    Dispense:  30 tablet    Refill:  0      I reviewed the patients updated PMH, FH, and SocHx.    Patient Active Problem List   Diagnosis Date Noted  . Adult acne 09/22/2019  . Panic disorder 10/03/2018  . Dependence on nicotine from cigarettes 05/17/2018  .  History of abnormal cervical Pap smear 05/17/2018  . Irritable bowel syndrome (IBS) 05/17/2018  . Encounter for Depo-Provera contraception 01/13/2018  . Allergic rhinitis 04/02/2014   Current Meds  Medication Sig  . albuterol (PROAIR HFA) 108 (90 Base) MCG/ACT inhaler Inhale 2 puffs into the lungs every 6 (six) hours as needed for wheezing or shortness of breath.  . ALPRAZolam (XANAX) 0.25 MG tablet Take 1 tablet (0.25 mg total) by mouth daily as needed for anxiety (panic attack).  . clindamycin (CLEOCIN-T) 1 % external solution Apply topically 2 (two) times daily.  Marland Kitchen escitalopram (LEXAPRO) 20 MG tablet Take 1 tablet (20 mg total) by mouth daily.  Marland Kitchen lidocaine (LIDODERM) 5 %  Place 1 patch onto the skin daily. Remove & Discard patch within 12 hours or as directed by MD  . methocarbamol (ROBAXIN) 500 MG tablet Take 1 tablet (500 mg total) by mouth 2 (two) times daily as needed for muscle spasms.  Marland Kitchen omeprazole (PRILOSEC) 20 MG capsule Take 1 capsule (20 mg total) by mouth daily. (Patient taking differently: Take 20 mg by mouth as needed.)  . promethazine (PHENERGAN) 25 MG tablet Take 1 tablet (25 mg total) by mouth every 6 (six) hours as needed for nausea or vomiting.  . [DISCONTINUED] methocarbamol (ROBAXIN) 500 MG tablet Take 1 tablet (500 mg total) by mouth 2 (two) times daily.  . [DISCONTINUED] naproxen sodium (ALEVE) 220 MG tablet Take 1 tablet (220 mg total) by mouth 2 (two) times daily as needed.    Allergies: Patient is allergic to prednisone and ciprofloxacin hcl. Family History: Patient family history includes Alcohol abuse in her maternal grandfather and paternal grandfather; COPD in her paternal grandmother; Healthy in her son; Hypertension in her father; Kidney disease in her mother; Learning disabilities in her mother. Social History:  Patient  reports that she has been smoking cigarettes. She has a 24.00 pack-year smoking history. She has never used smokeless tobacco. She reports current alcohol use. She reports that she does not use drugs.  Review of Systems: Constitutional: Negative for fever malaise or anorexia Cardiovascular: negative for chest pain Respiratory: negative for SOB or persistent cough Gastrointestinal: negative for abdominal pain  Objective  Vitals: BP 110/60   Pulse 77   Temp 98.3 F (36.8 C)   Resp 15   Ht 5\' 4"  (1.626 m)   Wt 112 lb 6.4 oz (51 kg)   SpO2 98%   BMI 19.29 kg/m  General: no acute distress , A&Ox3, nontoxic appearing HEENT: PEERL, conjunctiva normal, neck is supple Cardiovascular:  RRR without murmur or gallop.  Respiratory:  Good breath sounds bilaterally, CTAB with normal respiratory  effort Gastrointestinal: soft, flat abdomen, normal active bowel sounds, no palpable masses, no hepatosplenomegaly, no appreciated hernias Mild diffuse tenderness without rebound or guarding or masses Skin:  Warm, no rashes    Commons side effects, risks, benefits, and alternatives for medications and treatment plan prescribed today were discussed, and the patient expressed understanding of the given instructions. Patient is instructed to call or message via MyChart if he/she has any questions or concerns regarding our treatment plan. No barriers to understanding were identified. We discussed Red Flag symptoms and signs in detail. Patient expressed understanding regarding what to do in case of urgent or emergency type symptoms.   Medication list was reconciled, printed and provided to the patient in AVS. Patient instructions and summary information was reviewed with the patient as documented in the AVS. This note was prepared with assistance of Dragon  voice recognition software. Occasional wrong-word or sound-a-like substitutions may have occurred due to the inherent limitations of voice recognition software  This visit occurred during the SARS-CoV-2 public health emergency.  Safety protocols were in place, including screening questions prior to the visit, additional usage of staff PPE, and extensive cleaning of exam room while observing appropriate contact time as indicated for disinfecting solutions.

## 2021-04-09 NOTE — Telephone Encounter (Signed)
Pt called asking about her prescription that Dr. Mardelle Matte sent in for her. Pt states she needs it to go to a different pharmacy with [facility]. Please advise.

## 2021-04-09 NOTE — Patient Instructions (Addendum)
Please return for your annual complete physical; please come fasting.  I will release your lab results to you on your MyChart account with further instructions. Please reply with any questions.   Start the omeprazole daily, stop drinking coke, use the phenergan if needed for nausea.   If you have any questions or concerns, please don't hesitate to send me a message via MyChart or call the office at [phone]. Thank you for visiting with [place] today! It's our pleasure caring for you.

## 2021-04-10 NOTE — Telephone Encounter (Signed)
Left voicemail for patient to return call.

## 2021-06-11 ENCOUNTER — Emergency Department (HOSPITAL_BASED_OUTPATIENT_CLINIC_OR_DEPARTMENT_OTHER): Payer: Self-pay | Admitting: Radiology

## 2021-06-11 ENCOUNTER — Emergency Department (HOSPITAL_BASED_OUTPATIENT_CLINIC_OR_DEPARTMENT_OTHER): Payer: Self-pay

## 2021-06-11 ENCOUNTER — Other Ambulatory Visit: Payer: Self-pay

## 2021-06-11 ENCOUNTER — Emergency Department (HOSPITAL_BASED_OUTPATIENT_CLINIC_OR_DEPARTMENT_OTHER)
Admission: EM | Admit: 2021-06-11 | Discharge: 2021-06-11 | Disposition: A | Discharge status: Home / Self Care | Payer: Self-pay | Attending: Emergency Medicine | Admitting: Emergency Medicine

## 2021-06-11 ENCOUNTER — Encounter (HOSPITAL_BASED_OUTPATIENT_CLINIC_OR_DEPARTMENT_OTHER): Payer: Self-pay | Admitting: Emergency Medicine

## 2021-06-11 ENCOUNTER — Telehealth: Payer: Self-pay

## 2021-06-11 DIAGNOSIS — M546 Pain in thoracic spine: Secondary | ICD-10-CM | POA: Insufficient documentation

## 2021-06-11 DIAGNOSIS — F17210 Nicotine dependence, cigarettes, uncomplicated: Secondary | ICD-10-CM | POA: Insufficient documentation

## 2021-06-11 DIAGNOSIS — R0789 Other chest pain: Secondary | ICD-10-CM | POA: Insufficient documentation

## 2021-06-11 LAB — URINALYSIS, ROUTINE W REFLEX MICROSCOPIC
Bilirubin Urine: NEGATIVE
Glucose, UA: NEGATIVE mg/dL
Hgb urine dipstick: NEGATIVE
Ketones, ur: NEGATIVE mg/dL
Leukocytes,Ua: NEGATIVE
Nitrite: NEGATIVE
Protein, ur: NEGATIVE mg/dL
Specific Gravity, Urine: 1.048 — ABNORMAL HIGH (ref 1.005–1.030)
pH: 7 (ref 5.0–8.0)

## 2021-06-11 LAB — CBC
HCT: 40.8 % (ref 36.0–46.0)
Hemoglobin: 13.6 g/dL (ref 12.0–15.0)
MCH: 29.9 pg (ref 26.0–34.0)
MCHC: 33.3 g/dL (ref 30.0–36.0)
MCV: 89.7 fL (ref 80.0–100.0)
Platelets: 369 10*3/uL (ref 150–400)
RBC: 4.55 MIL/uL (ref 3.87–5.11)
RDW: 13.2 % (ref 11.5–15.5)
WBC: 8.7 10*3/uL (ref 4.0–10.5)
nRBC: 0 % (ref 0.0–0.2)

## 2021-06-11 LAB — HEPATIC FUNCTION PANEL
ALT: 8 U/L (ref 0–44)
AST: 12 U/L — ABNORMAL LOW (ref 15–41)
Albumin: 4.3 g/dL (ref 3.5–5.0)
Alkaline Phosphatase: 51 U/L (ref 38–126)
Bilirubin, Direct: <0.1 mg/dL (ref 0.0–0.2)
Total Bilirubin: 0.4 mg/dL (ref 0.3–1.2)
Total Protein: 6.5 g/dL (ref 6.5–8.1)

## 2021-06-11 LAB — BASIC METABOLIC PANEL
Anion gap: 7 (ref 5–15)
BUN: 7 mg/dL (ref 6–20)
CO2: 26 mmol/L (ref 22–32)
Calcium: 9.6 mg/dL (ref 8.9–10.3)
Chloride: 107 mmol/L (ref 98–111)
Creatinine, Ser: 0.8 mg/dL (ref 0.44–1.00)
GFR, Estimated: >60 mL/min (ref >60)
Glucose, Bld: 94 mg/dL (ref 70–99)
Potassium: 4.4 mmol/L (ref 3.5–5.1)
Sodium: 140 mmol/L (ref 135–145)

## 2021-06-11 LAB — TROPONIN I (HIGH SENSITIVITY): Troponin I (High Sensitivity): 3 ng/L (ref <18)

## 2021-06-11 MED ORDER — IBUPROFEN 600 MG PO TABS: 600.0000 mg | ORAL_TABLET | Freq: Four times a day (QID) | ORAL | 0 refills | Status: AC | PRN

## 2021-06-11 MED ORDER — IOHEXOL 350 MG/ML SOLN
60.0000 mL | Freq: Once | INTRAVENOUS | Status: AC | PRN
  Administered 2021-06-11: 60 mL via INTRAVENOUS

## 2021-06-11 MED ORDER — ACETAMINOPHEN 325 MG PO TABS: 650.0000 mg | ORAL_TABLET | Freq: Four times a day (QID) | ORAL | 0 refills | Status: DC | PRN

## 2021-06-11 MED ORDER — OXYCODONE HCL 5 MG PO TABS: 5.0000 mg | ORAL_TABLET | Freq: Four times a day (QID) | ORAL | 0 refills | Status: DC | PRN

## 2021-06-11 MED ORDER — MORPHINE SULFATE (PF) 4 MG/ML IV SOLN
4.0000 mg | Freq: Once | INTRAVENOUS | Status: AC
  Administered 2021-06-11: 4 mg via INTRAVENOUS
  Filled 2021-06-11: qty 1

## 2021-06-11 NOTE — ED Provider Notes (Signed)
[facility] EMERGENCY DEPT Provider Note   CSN: [id number] Arrival date & time: 06/11/21  2025     History Chief Complaint  Patient presents with   Chest Pain    Kristine Holder is a 45 y.o. female with a history of smoking presented ED with left-sided chest pain.  The patient reports that she has had "a cold for the past few days".  She woke up this morning with pressure in her left chest.  It is a sharp sensation and a pressure sensation that radiates around her left rib line towards her back.  It hurts with inspiration.  She is never had this feeling before.  She does report she is a chronic smoker's cough but no change in sputum production.  She denies any recent fevers or chills.  She denies any falls or traumas.  No hemoptysis or asymmetric LE edema. Patient denies personal or family history of DVT or PE. No recent hormone use (including OCP); travel for >6 hours; prolonged immobilization for greater than 3 days; surgeries or trauma in the last 4 weeks; or malignancy with treatment within 6 months.  She denies any history of high cholesterol, diabetes, or significant family history of MI.  She reports she does have high blood pressure.  She also smokes "less than a pack a day".  HPI     Past Medical History:  Diagnosis Date   Dependence on nicotine from cigarettes 05/17/2018   History of abnormal cervical Pap smear 05/17/2018   H/o LEEP 2008 with nl f/u until 2015: nl pap with + HR HPV - no follow up.   Human papilloma virus (HPV) infection    Irritable bowel syndrome (IBS) 05/17/2018   Migraine     Patient Active Problem List   Diagnosis Date Noted   Adult acne 09/22/2019   Panic disorder 10/03/2018   Dependence on nicotine from cigarettes 05/17/2018   History of abnormal cervical Pap smear 05/17/2018   Irritable bowel syndrome (IBS) 05/17/2018   Encounter for Depo-Provera contraception 01/13/2018   Allergic rhinitis 04/02/2014    Past Surgical History:   Procedure Laterality Date   LEEP  2008     OB History   No obstetric history on file.     Family History  Problem Relation Age of Onset   Hypertension Father    Kidney disease Mother    Learning disabilities Mother    Healthy Son    Alcohol abuse Maternal Grandfather    COPD Paternal Grandmother    Alcohol abuse Paternal Grandfather     Social History   Tobacco Use   Smoking status: Every Day    Packs/day: 1.00    Years: 24.00    Pack years: 24.00    Types: Cigarettes   Smokeless tobacco: Never   Tobacco comments:    counseled verbally during visits  Vaping Use   Vaping Use: Never used  Substance Use Topics   Alcohol use: Yes   Drug use: Never    Home Medications Prior to Admission medications   Medication Sig Start Date End Date Taking? Authorizing Provider  acetaminophen (TYLENOL) 325 MG tablet Take 2 tablets (650 mg total) by mouth every 6 (six) hours as needed for up to 30 doses for moderate pain or mild pain. 06/11/21  Yes Terald Sleeper, MD  ibuprofen (ADVIL) 600 MG tablet Take 1 tablet (600 mg total) by mouth every 6 (six) hours as needed for mild pain or moderate pain. 06/11/21 07/11/21 Yes Matylda Fehring,  Kermit Balo, MD  oxyCODONE (ROXICODONE) 5 MG immediate release tablet Take 1 tablet (5 mg total) by mouth every 6 (six) hours as needed for up to 12 doses for severe pain. 06/11/21  Yes Terald Sleeper, MD  albuterol (PROAIR HFA) 108 (90 Base) MCG/ACT inhaler Inhale 2 puffs into the lungs every 6 (six) hours as needed for wheezing or shortness of breath. 02/06/21   Jarold Motto, PA  ALPRAZolam Prudy Feeler) 0.25 MG tablet Take 1 tablet (0.25 mg total) by mouth daily as needed for anxiety (panic attack). 07/10/20   Jarold Motto, PA  budesonide-formoterol [facility]) 80-4.5 MCG/ACT inhaler Inhale 2 puffs into the lungs in the morning and at bedtime. Patient not taking: Reported on 04/09/2021 02/05/21   Jarold Motto, PA  clindamycin (CLEOCIN-T) 1 % external solution Apply  topically 2 (two) times daily. 09/22/19   Willow Ora, MD  escitalopram (LEXAPRO) 20 MG tablet Take 1 tablet (20 mg total) by mouth daily. 02/06/21   Jarold Motto, PA  lidocaine (LIDODERM) 5 % Place 1 patch onto the skin daily. Remove & Discard patch within 12 hours or as directed by MD 02/01/21   Harolyn Rutherford C, PA-C  methocarbamol (ROBAXIN) 500 MG tablet Take 1 tablet (500 mg total) by mouth 2 (two) times daily as needed for muscle spasms. 04/09/21   Willow Ora, MD  omeprazole (PRILOSEC) 20 MG capsule Take 1 capsule (20 mg total) by mouth daily. Patient taking differently: Take 20 mg by mouth as needed. 11/14/20   Arby Barrette, MD  promethazine (PHENERGAN) 25 MG tablet Take 1 tablet (25 mg total) by mouth every 6 (six) hours as needed for nausea or vomiting. 04/09/21   Willow Ora, MD    Allergies    Prednisone and Ciprofloxacin hcl  Review of Systems   Review of Systems  Constitutional:  Negative for chills and fever.  Respiratory:  Negative for cough and shortness of breath.   Cardiovascular:  Positive for chest pain. Negative for palpitations.  Gastrointestinal:  Negative for abdominal pain and vomiting.  Genitourinary:  Negative for dysuria and hematuria.  Musculoskeletal:  Negative for arthralgias and back pain.  Skin:  Negative for color change and rash.  Neurological:  Negative for seizures and syncope.  All other systems reviewed and are negative.  Physical Exam Updated Vital Signs BP (!) 162/91   Pulse (!) 56   Temp 98.3 F (36.8 C) (Oral)   Resp 14   Ht 5\' 3"  (1.6 m)   Wt 48.5 kg   SpO2 99%   BMI 18.95 kg/m   Physical Exam Constitutional:      General: She is not in acute distress. HENT:     Head: Normocephalic and atraumatic.  Eyes:     Conjunctiva/sclera: Conjunctivae normal.     Pupils: Pupils are equal, round, and reactive to light.  Cardiovascular:     Rate and Rhythm: Normal rate and regular rhythm.  Pulmonary:     Effort: Pulmonary effort  is normal. No respiratory distress.  Abdominal:     General: There is no distension.     Tenderness: There is no abdominal tenderness.  Musculoskeletal:     Comments: Left lower and lateral chest wall tenderness, thoracic midback and left mid-back tenderness  Skin:    General: Skin is warm and dry.  Neurological:     General: No focal deficit present.     Mental Status: She is alert. Mental status is at baseline.  Psychiatric:  Mood and Affect: Mood normal.        Behavior: Behavior normal.    ED Results / Procedures / Treatments   Labs (all labs ordered are listed, but only abnormal results are displayed) Labs Reviewed  HEPATIC FUNCTION PANEL - Abnormal; Notable for the following components:      Result Value   AST 12 (*)    All other components within normal limits  URINALYSIS, ROUTINE W REFLEX MICROSCOPIC - Abnormal; Notable for the following components:   Color, Urine COLORLESS (*)    Specific Gravity, Urine 1.048 (*)    All other components within normal limits  BASIC METABOLIC PANEL  CBC  TROPONIN I (HIGH SENSITIVITY)    EKG EKG Interpretation  Date/Time:  Wednesday June 11 2021 09:43:53 EDT Ventricular Rate:  56 PR Interval:  127 QRS Duration: 78 QT Interval:  426 QTC Calculation: 412 R Axis:   81 Text Interpretation: Sinus rhythm No STEMI Confirmed by Alvester Chou [phone]) on 06/11/2021 12:26:08 PM  Radiology DG Chest 2 View  Result Date: 06/11/2021 CLINICAL DATA:  Shortness of breath, chest pressure EXAM: CHEST - 2 VIEW COMPARISON:  02/01/2021 FINDINGS: The heart size and mediastinal contours are within normal limits. Both lungs are clear. The visualized skeletal structures are unremarkable. IMPRESSION: No acute abnormality of the lungs. Electronically Signed   By: Lauralyn Primes M.D.   On: 06/11/2021 10:29   CT Angio Chest PE W and/or Wo Contrast  Result Date: 06/11/2021 CLINICAL DATA:  Left pleuritic chest pain. Chest pressure and tightness. EXAM: CT  ANGIOGRAPHY CHEST WITH CONTRAST TECHNIQUE: Multidetector CT imaging of the chest was performed using the standard protocol during bolus administration of intravenous contrast. Multiplanar CT image reconstructions and MIPs were obtained to evaluate the vascular anatomy. CONTRAST:  36mL OMNIPAQUE IOHEXOL 350 MG/ML SOLN COMPARISON:  None. FINDINGS: Cardiovascular: Negative for pulmonary embolus. Atherosclerotic calcification of the aorta. Heart is at the upper limits of normal in size. Left ventricle appears slightly dilated. No pericardial effusion. Mediastinum/Nodes: Mediastinal and hilar lymph nodes are not enlarged by CT size criteria. No axillary adenopathy. Esophagus is grossly unremarkable. Lungs/Pleura: Biapical pleuroparenchymal scarring. Mild paraseptal emphysema. Smoking related respiratory bronchiolitis. Subpleural right upper lobe nodules measure up to 3 mm (6/26). Lungs are otherwise clear. No pleural fluid. Airway is unremarkable. Upper Abdomen: Visualized portions of the liver, adrenal glands, kidneys, spleen, pancreas and stomach are grossly unremarkable. Musculoskeletal: Mild degenerative changes in the spine. Probable bone island in the T3 vertebral body. No worrisome lytic or sclerotic lesions. No fracture. Review of the MIP images confirms the above findings. IMPRESSION: 1. Negative for pulmonary embolus. No findings to explain the patient's pleuritic chest pain. 2.  Aortic atherosclerosis (ICD10-I70.0). 3.  Emphysema (ICD10-J43.9). Electronically Signed   By: Leanna Battles M.D.   On: 06/11/2021 12:17    Procedures Procedures   Medications Ordered in ED Medications  morphine 4 MG/ML injection 4 mg (4 mg Intravenous Given 06/11/21 1116)  iohexol (OMNIPAQUE) 350 MG/ML injection 60 mL (60 mLs Intravenous Contrast Given 06/11/21 1141)    ED Course  I have reviewed the triage vital signs and the nursing notes.  Pertinent labs & imaging results that were available during my care of the  patient were reviewed by me and considered in my medical decision making (see chart for details).  This patient presents to the Emergency Department with complaint of chest pain. This involves an extensive number of treatment options, and is a complaint that carries with it  a high risk of complications and morbidity.  The differential diagnosis includes ACS vs Pneumothorax vs Reflux/Gastritis vs MSK pain vs Pneumonia vs PE vs other.  Also on the differential here would be referred thoracic pain or radiculopathy as it does appear that the pain is radiating from her left thoracic midback.  No trauma to suspect an acute spinal fx.  I ordered, reviewed, and interpreted labs, including BMP and CBC.  There were no immediate, life-threatening emergencies found in this labwork.  The patient's troponin level was 3 (baseline). I ordered medication IV morphine for chest pain I ordered imaging studies which included CT PE  I independently visualized and interpreted imaging which showed no acute PE, PTX, or evident PNA. I personally reviewed the patients ECG which showed sinus rhythm with no acute ischemic findings  After the interventions stated above, I reevaluated the patient and found that they remained clinically stable.  Lower suspicion for acute intraabdominal process as her pain is reproducible on musculoskeletal exam.  Doubt splenic infarct or colitis or infection. Doubt UTI/pyelonepritis - UA without sign of infection and Cr normal.    Based on the patient's clinical exam, vital signs, risk factors, and ED testing, I felt that the patient's overall risk of life-threatening emergency such as ACS, PE, sepsis, or infection was low.  At this time, I felt the patient's presentation was most clinically consistent with thoracic radiculopathy or referred pain, but explained to the patient that this evaluation was not a definitive diagnostic workup.  I discussed outpatient follow up with primary care provider,  and provided specialist office number on the patient's discharge paper if a referral was deemed necessary.  Return precautions were discussed with the patient.  I felt the patient was clinically stable for discharge.   Clinical Course as of 06/11/21 1740  Wed Jun 11, 2021  1314 Patient is significant improvement of the pain after the morphine.  Mother is now here.  We discussed follow-up with her PCP.  Okay for discharge [MT]    Clinical Course User Index [MT] Terald Sleeperrifan, Nitika Jackowski J, MD    Final Clinical Impression(s) / ED Diagnoses Final diagnoses:  Chest wall pain    Rx / DC Orders ED Discharge Orders          Ordered    oxyCODONE (ROXICODONE) 5 MG immediate release tablet  Every 6 hours PRN        06/11/21 1315    acetaminophen (TYLENOL) 325 MG tablet  Every 6 hours PRN        06/11/21 1315    ibuprofen (ADVIL) 600 MG tablet  Every 6 hours PRN        06/11/21 1315             Terald Sleeperrifan, Staria Birkhead J, MD 06/11/21 1743

## 2021-06-11 NOTE — ED Triage Notes (Signed)
Pt arrives to ED with c/o chest pressure and tightness.The chest pressure is left lower chest just under left breast. The pressure radiates to pts left shoulder blade. Pt reports pain when taking deep breaths. Pt does report new clear rhinorrhea. Pt reports brand-new window unit in her home since yesterday. Pt reports no A?C prior to yesterday x3 weeks. Denies nausea, vomiting, DOE, and orthopnea. No fevers, chills. Pt smokes 1/2 ppd x25 years.

## 2021-06-11 NOTE — Telephone Encounter (Signed)
Patient called in wanting an apt with dr. Mardelle Matte today. I asked patient what she needed apt for and she stated severe chest pains. I proceeded to triage patient but phone lines are down for calling out.   Asked hasna to call back patient to triage

## 2021-06-11 NOTE — Telephone Encounter (Signed)
Spoke to pt asked her when her chest pain started? Pt said this morning when she woke up. Asked her where located? Pt said left side of chest, hurts when she coughs and takes a deep breathe, also pain in back. What would you rate your pain on scale of 1-10? Pt said 8/10. Asked if any pain down arms? Pt said yes left arm. Any numbness or tingling? Pt said no. Asked pt and headache, dizziness, blurred vision, SOB or nausea? Pt denied all other symptoms. Told pt she needs to go to the ED or Urgent Care to be evaluated. Pt said last time she went to Urgent care they sent her to the ED. Asked pt can she go to the new ED at [facility]? Pt said yes. Told her okay please go there now to be evaluated. Pt verbalized understanding.

## 2021-07-11 ENCOUNTER — Telehealth: Payer: Self-pay

## 2021-07-11 MED ORDER — ESCITALOPRAM OXALATE 20 MG PO TABS: 20.0000 mg | ORAL_TABLET | Freq: Every day | ORAL | 0 refills | Status: DC

## 2021-07-11 NOTE — Telephone Encounter (Signed)
.   Encourage patient to contact the pharmacy for refills or they can request refills through [facility]  LAST APPOINTMENT DATE:  Please schedule appointment if longer than 1 year  NEXT APPOINTMENT DATE:  MEDICATION:escitalopram (LEXAPRO) 20 MG tablet  Is the patient out of medication?   PHARMACY:[id number] - [place], [place] - [address]

## 2021-07-11 NOTE — Telephone Encounter (Signed)
Rx sent to [facility], called and cancelled Rx at [facility].

## 2021-07-14 ENCOUNTER — Telehealth: Payer: Self-pay

## 2021-07-14 ENCOUNTER — Telehealth (INDEPENDENT_AMBULATORY_CARE_PROVIDER_SITE_OTHER): Payer: Self-pay | Admitting: Family Medicine

## 2021-07-14 DIAGNOSIS — J309 Allergic rhinitis, unspecified: Secondary | ICD-10-CM

## 2021-07-14 DIAGNOSIS — J329 Chronic sinusitis, unspecified: Secondary | ICD-10-CM

## 2021-07-14 DIAGNOSIS — F410 Panic disorder [episodic paroxysmal anxiety] without agoraphobia: Secondary | ICD-10-CM

## 2021-07-14 MED ORDER — ALPRAZOLAM 0.25 MG PO TABS: 0.2500 mg | ORAL_TABLET | Freq: Every day | ORAL | 0 refills | Status: DC | PRN

## 2021-07-14 MED ORDER — AZELASTINE HCL 0.1 % NA SOLN: 2.0000 | Freq: Two times a day (BID) | NASAL | 12 refills | Status: DC

## 2021-07-14 MED ORDER — BENZONATATE 200 MG PO CAPS: 200.0000 mg | ORAL_CAPSULE | Freq: Two times a day (BID) | ORAL | 0 refills | Status: DC | PRN

## 2021-07-14 MED ORDER — AZITHROMYCIN 250 MG PO TABS: ORAL_TABLET | ORAL | 0 refills | Status: DC

## 2021-07-14 NOTE — Telephone Encounter (Signed)
Patient came to pick up her letter to return to work but patient would like to wrote out till 07/16/21 instead. Please advise.

## 2021-07-14 NOTE — Telephone Encounter (Signed)
See note

## 2021-07-14 NOTE — Progress Notes (Signed)
   Kristine Holder is a 45 y.o. female who presents today for a virtual office visit.  Assessment/Plan:  New/Acute Problems: Sinusitis No red flags.  Start Astelin.  We will send pocket prescription for azithromycin with instruction to not start unless symptoms worsen or do not improve over the next few days.  Also send in [place].  She will repeat COVID test in a few days if not improving.  Discussed reasons to return to care.  Chronic Problems Addressed Today: Panic disorder Not controlled.  Lexapro was sent in last week but she has not yet started due to lack of insurance.  She will be picking up from the pharmacy today.  We will also give small supply of Xanax to use as needed-this was last prescribed by her PCP about a year ago but she has had significant worsening of symptoms since running out of Lexapro.  Discussed with patient this would be short-term while she gets back on Lexapro.  She will follow-up with her PCP in a few weeks.     Subjective:  HPI:  Patient here with symptoms of sinus infection.  Started yesterday.  Symptoms include sore throat, drainage, head pressure, headache, and cough.  Mom has been sick with similar symptoms.  She took a COVID test which was negative.  No wheezing.  No shortness of breath.  No fevers.  She is also been out of Lexapro for the past few weeks.  She recently lost her insurance and has not been able to afford her medications.  She has been feeling more anxious and more on edge.  Is also been more tearful and emotional.  She takes Xanax as needed to help with panic attacks.       Objective/Observations  Physical Exam: Gen: NAD, resting comfortably Pulm: Normal work of breathing Neuro: Grossly normal, moves all extremities Psych: Normal affect and thought content  Virtual Visit via Video   I connected with Kristine Holder on 07/14/21 at  8:40 AM EDT by a video enabled telemedicine application and verified that I am speaking with the  correct person using two identifiers. The limitations of evaluation and management by telemedicine and the availability of in person appointments were discussed. The patient expressed understanding and agreed to proceed.   Patient location: Home Provider location: [place] [facility] [employer] Persons participating in the virtual visit: Myself and Patient     Katina Degree. Jimmey Ralph, MD 07/14/2021 8:49 AM

## 2021-07-14 NOTE — Telephone Encounter (Signed)
Ok to give updated letter.  Katina Degree. Jimmey Ralph, MD 07/14/2021 4:00 PM

## 2021-07-15 ENCOUNTER — Encounter: Payer: Self-pay | Admitting: Family Medicine

## 2021-07-15 ENCOUNTER — Telehealth: Payer: Self-pay

## 2021-07-15 NOTE — Telephone Encounter (Signed)
Letter send  Patient notified

## 2021-07-15 NOTE — Telephone Encounter (Signed)
Pt is scheduled with Alyssa tomorrow

## 2021-07-15 NOTE — Telephone Encounter (Signed)
Called and spoke with patient and patient stated that her initial virtual appt with Dr Jimmey Ralph was for what she thought to be sinus infection but after the appt she took a home test that resulted positive. I advised the patient to schedule another virtual visit with her pcp or another provider. Patient understood.

## 2021-07-15 NOTE — Telephone Encounter (Signed)
Patient stated symptoms worsen today Will do a Home Covid test today, will call with results

## 2021-07-15 NOTE — Telephone Encounter (Signed)
Pt called stating that she tested positive for covid. She did a virutal yesterday with Dr Jimmey Ralph and she is unsure what to do now. If she needs to be seen again she stated that she will make another appt. Please Advise

## 2021-07-15 NOTE — Telephone Encounter (Signed)
Pt called back stating that her job needs official documentation that she is positive for covid and she does have the information where to send the information

## 2021-07-15 NOTE — Telephone Encounter (Signed)
Thank you :)

## 2021-07-16 ENCOUNTER — Encounter: Payer: Self-pay | Admitting: Physician Assistant

## 2021-07-16 ENCOUNTER — Telehealth (INDEPENDENT_AMBULATORY_CARE_PROVIDER_SITE_OTHER): Payer: Self-pay | Admitting: Physician Assistant

## 2021-07-16 VITALS — Temp 99.8°F | Ht 63.0 in

## 2021-07-16 DIAGNOSIS — J438 Other emphysema: Secondary | ICD-10-CM

## 2021-07-16 DIAGNOSIS — U071 COVID-19: Secondary | ICD-10-CM

## 2021-07-16 MED ORDER — NIRMATRELVIR/RITONAVIR (PAXLOVID)TABLET: 3.0000 | ORAL_TABLET | Freq: Two times a day (BID) | ORAL | 0 refills | Status: AC

## 2021-07-16 NOTE — Patient Instructions (Signed)
Take the Paxlovid as directed. Keep me updated and feel better!

## 2021-07-16 NOTE — Progress Notes (Signed)
Virtual Visit via Video Note  I connected with Kristine Holder on 07/16/21 at 11:30 AM EDT by a video enabled telemedicine application and verified that I am speaking with the correct person using two identifiers.  Location: Patient: home Provider: Nature conservation officer at [employer] Persons present: Patient and myself   I discussed the limitations of evaluation and management by telemedicine and the availability of in person appointments. The patient expressed understanding and agreed to proceed.   History of Present Illness: Chief complaint: COVID+ via home antigen test on 07/15/21 Symptom onset: 07/13/21 Pertinent positives: Achy, headache, sinus pressure, cough Pertinent negatives: SOB, chest pain Treatments tried: Tylenol and Tessalon Vaccine status: Pfizer x 2, no boosters  Sick exposure: Unknown    Observations/Objective:   Gen: Awake, alert, no acute distress, congested Resp: Breathing is even and non-labored Psych: calm/pleasant demeanor Neuro: Alert and Oriented x 3, + facial symmetry, speech is clear.   Assessment and Plan: 1. COVID-19 2. Other emphysema (HCC) Diagnosis confirmed via home antigen test.  Patient is currently having mild to moderate symptoms.  We discussed current algorithm recommendations for prescribing outpatient antivirals.  As the patient is under the age of 65, but has hx of emphysema, and does have vaccine status, antiviral treatment is indicated at this time.  Risks versus benefits discussed.  Both agreed on Paxlovid for treatment. GFR normal. No interactions with medications. SE of metallic taste or diarrhea discussed. Advised self-isolation at home for the next 5 days and then masking around others for at least an additional 5 days.  Treat supportively as well at this time including sleeping prone, deep breathing exercises, pushing fluids, walking every few hours, vitamins C and D, and Tylenol or ibuprofen as needed.  The patient understands  that COVID-19 illness can wax and wane.  Should the symptoms acutely worsen or patient starts to experience sudden shortness of breath, chest pain, severe weakness, the patient will go straight to the emergency department.  Also advised home pulse oximetry monitoring and for any reading consistently under 92%, should also report to the emergency department.  The patient will continue to keep [place] updated.    Follow Up Instructions:    I discussed the assessment and treatment plan with the patient. The patient was provided an opportunity to ask questions and all were answered. The patient agreed with the plan and demonstrated an understanding of the instructions.   The patient was advised to call back or seek an in-person evaluation if the symptoms worsen or if the condition fails to improve as anticipated.  Oluwaseyi Tull M Kambria Grima, PA-C

## 2021-07-18 ENCOUNTER — Encounter: Payer: Self-pay | Admitting: Physician Assistant

## 2021-07-18 ENCOUNTER — Telehealth: Payer: Self-pay

## 2021-07-18 NOTE — Telephone Encounter (Signed)
Patient is requesting a note for work tomorrow she states she is not feeling much better but wants to try to go to work on Sunday can we print it out and call her when its ready to be picked up

## 2021-08-11 ENCOUNTER — Ambulatory Visit (HOSPITAL_COMMUNITY): Payer: Self-pay

## 2021-09-23 ENCOUNTER — Telehealth (INDEPENDENT_AMBULATORY_CARE_PROVIDER_SITE_OTHER): Payer: Managed Care, Other (non HMO) | Admitting: Family Medicine

## 2021-09-23 ENCOUNTER — Encounter: Payer: Self-pay | Admitting: Family Medicine

## 2021-09-23 ENCOUNTER — Other Ambulatory Visit: Payer: Self-pay

## 2021-09-23 DIAGNOSIS — H6981 Other specified disorders of Eustachian tube, right ear: Secondary | ICD-10-CM

## 2021-09-23 DIAGNOSIS — J069 Acute upper respiratory infection, unspecified: Secondary | ICD-10-CM | POA: Diagnosis not present

## 2021-09-23 MED ORDER — METHYLPREDNISOLONE 4 MG PO TBPK: ORAL_TABLET | ORAL | 0 refills | Status: DC

## 2021-09-23 MED ORDER — BENZONATATE 200 MG PO CAPS: 200.0000 mg | ORAL_CAPSULE | Freq: Two times a day (BID) | ORAL | 0 refills | Status: DC | PRN

## 2021-09-23 NOTE — Progress Notes (Signed)
Chief Complaint  Patient presents with   Cough   Sore Throat    Congestion Tested Negative for Covid today 09/23/21.    Kristine Holder here for URI complaints. Due to COVID-19 pandemic, we are interacting via telephone. I verified patient's ID using 2 identifiers. Patient agreed to proceed with visit via this method. Patient is at home, I am at office. Patient and I are present for visit.   Duration: 3 days  Associated symptoms: sinus congestion, rhinorrhea, ear pain, sore throat, and coughing Denies: itchy watery eyes, ear pain, ear drainage, wheezing, shortness of breath, myalgia, and fevers, sinus pain, N/V/D Treatment to date: Tylenol Sick contacts: No Tested neg for covid.   Past Medical History:  Diagnosis Date   Dependence on nicotine from cigarettes 05/17/2018   History of abnormal cervical Pap smear 05/17/2018   H/o LEEP 2008 with nl f/u until 2015: nl pap with + HR HPV - no follow up.   Human papilloma virus (HPV) infection    Irritable bowel syndrome (IBS) 05/17/2018   Migraine     Objective No conversational dyspnea Age appropriate judgment and insight Nml affect and mood  Viral URI with cough - Plan: benzonatate (TESSALON) 200 MG capsule  Dysfunction of right eustachian tube - Plan: methylPREDNISolone (MEDROL DOSEPAK) 4 MG TBPK tablet  Tessalon Perles prn cough. Medrol dosepak for ear. Continue to push fluids, practice good hand hygiene, cover mouth when coughing. F/u prn. If starting to experience irreplaceable fluid loss, shaking, or shortness of breath, seek immediate care. Total time: 11 min Pt voiced understanding and agreement to the plan.  Jilda Roche [place], DO 09/23/21 1:45 PM

## 2021-09-24 ENCOUNTER — Telehealth: Payer: Self-pay

## 2021-09-24 NOTE — Telephone Encounter (Signed)
Pt called wanting to know if we can print out the work note written by Dr Carmelia Roller. Francoise wants to pick up note tomorrow 09/25/21. Please Advise.

## 2021-09-25 NOTE — Telephone Encounter (Signed)
Printed out note for patient, left voicemail to make patient aware

## 2021-12-19 ENCOUNTER — Other Ambulatory Visit: Payer: Self-pay | Admitting: Family Medicine

## 2021-12-19 MED ORDER — ALPRAZOLAM 0.25 MG PO TABS: 0.2500 mg | ORAL_TABLET | Freq: Every day | ORAL | 0 refills | Status: DC | PRN

## 2021-12-19 MED ORDER — ESCITALOPRAM OXALATE 20 MG PO TABS: 20.0000 mg | ORAL_TABLET | Freq: Every day | ORAL | 0 refills | Status: DC

## 2021-12-19 NOTE — Telephone Encounter (Signed)
Patient needs refills of Lexapro and Xanax. [facility] to [facility] [address].      [facility]

## 2021-12-19 NOTE — Telephone Encounter (Signed)
Medication has been filled 

## 2021-12-19 NOTE — Telephone Encounter (Signed)
Last OV 07/16/2021 dx covid 19 Last Refill 07/14/2021

## 2021-12-19 NOTE — Telephone Encounter (Signed)
Patient needs refill of ALPRAZolam (XANAX) 0.25 MG tablet  and Xanax. [facility] [address].      [facility]

## 2021-12-19 NOTE — Telephone Encounter (Signed)
Last OV 07/16/2021 dx covid 19 °Last Refill 07/14/2021 °

## 2022-02-11 ENCOUNTER — Other Ambulatory Visit: Payer: Self-pay | Admitting: Family Medicine

## 2022-03-03 ENCOUNTER — Ambulatory Visit (INDEPENDENT_AMBULATORY_CARE_PROVIDER_SITE_OTHER): Payer: No Typology Code available for payment source | Admitting: Family Medicine

## 2022-03-03 ENCOUNTER — Encounter: Payer: Self-pay | Admitting: Family Medicine

## 2022-03-03 VITALS — BP 100/60 | HR 60 | Temp 98.0°F | Ht 64.0 in | Wt 104.4 lb

## 2022-03-03 DIAGNOSIS — J069 Acute upper respiratory infection, unspecified: Secondary | ICD-10-CM

## 2022-03-03 MED ORDER — AZITHROMYCIN 250 MG PO TABS: ORAL_TABLET | ORAL | 0 refills | Status: AC

## 2022-03-03 MED ORDER — BENZONATATE 100 MG PO CAPS: 100.0000 mg | ORAL_CAPSULE | Freq: Three times a day (TID) | ORAL | 0 refills | Status: DC | PRN

## 2022-03-03 NOTE — Patient Instructions (Signed)
Meds have been sent the the pharmacy °You can take tylenol for pain/fevers °If worsening symptoms, let us know or go to the Emergency room  ° ° °

## 2022-03-03 NOTE — Progress Notes (Signed)
? ?Subjective:  ? ? ? Patient ID: Kristine Holder, female    DOB: [birth year], 46 y.o.   MRN: [id number] ? ?Chief Complaint  ?Patient presents with  ? Fever  ?  Fever 100.5 this morning ?Took ibuprofen at 7am  ?  ? Sore Throat  ? Nasal Congestion  ? Cough  ?  Productive cough that started on yesterday  ? ? ?HPI ?Chief complaint: cough,congestion ?Symptom onset: yest ?Pertinent positives: cough, sore throat congestion, fever 100.5 this am.  Chest hurts and insp pain, sl sob, body aches ?Pertinent negatives: no v/d.  Covid neg this am ?Treatments tried: ibu ?Vaccine status: utd ?Sick exposure: none  ?LMP: last wk-   off depo for 1 yr.  Not SA ?Health Maintenance Due  ?Topic Date Due  ? Hepatitis C Screening  Never done  ? COLONOSCOPY (Pts 45-26yrs Insurance coverage will need to be confirmed)  Never done  ? TETANUS/TDAP  01/07/2021  ? PAP SMEAR-Modifier  05/31/2021  ? ? ?Past Medical History:  ?Diagnosis Date  ? Dependence on nicotine from cigarettes 05/17/2018  ? History of abnormal cervical Pap smear 05/17/2018  ? H/o LEEP 2008 with nl f/u until 2015: nl pap with + HR HPV - no follow up.  ? Human papilloma virus (HPV) infection   ? Irritable bowel syndrome (IBS) 05/17/2018  ? Migraine   ? ? ?Past Surgical History:  ?Procedure Laterality Date  ? LEEP  2008  ? ? ?Outpatient Medications Prior to Visit  ?Medication Sig Dispense Refill  ? acetaminophen (TYLENOL) 325 MG tablet Take 2 tablets (650 mg total) by mouth every 6 (six) hours as needed for up to 30 doses for moderate pain or mild pain. 30 tablet 0  ? ALPRAZolam (XANAX) 0.25 MG tablet TAKE 1 TABLET BY MOUTH DAILY AS NEEDED FOR ANXIETY 30 tablet 2  ? clindamycin (CLEOCIN-T) 1 % external solution Apply topically 2 (two) times daily. 60 mL 3  ? escitalopram (LEXAPRO) 20 MG tablet Take 1 tablet (20 mg total) by mouth daily. 90 tablet 0  ? ibuprofen (ADVIL) 600 MG tablet TAKE 1 TABLET BY MOUTH EVERY 6 HOURS AS NEEDED FOR MILD PAIN OR MODERATE PAIN.    ? methocarbamol  (ROBAXIN) 500 MG tablet Take 1 tablet (500 mg total) by mouth 2 (two) times daily as needed for muscle spasms. 30 tablet 0  ? albuterol (PROAIR HFA) 108 (90 Base) MCG/ACT inhaler Inhale 2 puffs into the lungs every 6 (six) hours as needed for wheezing or shortness of breath. 1 each 6  ? benzonatate (TESSALON) 200 MG capsule Take 1 capsule (200 mg total) by mouth 2 (two) times daily as needed for cough. 20 capsule 0  ? budesonide-formoterol (SYMBICORT) 80-4.5 MCG/ACT inhaler Inhale 2 puffs into the lungs in the morning and at bedtime. 1 each 12  ? lidocaine (LIDODERM) 5 % Place 1 patch onto the skin daily. Remove & Discard patch within 12 hours or as directed by MD 30 patch 0  ? methylPREDNISolone (MEDROL DOSEPAK) 4 MG TBPK tablet Follow instructions on package. 21 tablet 0  ? omeprazole (PRILOSEC) 20 MG capsule Take 1 capsule (20 mg total) by mouth daily. (Patient taking differently: Take 20 mg by mouth as needed.) 30 capsule 0  ? ?No facility-administered medications prior to visit.  ? ? ?Allergies  ?Allergen Reactions  ? Ciprofloxacin Hcl Nausea And Vomiting  ?  Other reaction(s): Other (See Comments)  ? Prednisone Other (See Comments)  ?  Makes patient feel  loopy ?Other reaction(s): Other (See Comments) ?Makes patient feel loopy ?Makes patient feel loopy  ? ?ROS neg/noncontributory except as noted HPI/below ? ? ?   ?Objective:  ?  ? ?BP 100/60   Pulse 60   Temp 98 ?F (36.7 ?C) (Temporal)   Ht 5\' 4"  (1.626 m)   Wt 104 lb 6 oz (47.3 kg)   SpO2 97%   BMI 17.92 kg/m?  ?Wt Readings from Last 3 Encounters:  ?03/03/22 104 lb 6 oz (47.3 kg)  ?06/11/21 107 lb (48.5 kg)  ?04/09/21 112 lb 6.4 oz (51 kg)  ? ? ?Physical Exam  ? ?Gen: WDWN NAD thin wf. ?HEENT: NCAT, conjunctiva not injected, sclera nonicteric ?TM WNL B, OP moist, no exudates  ?NECK:  supple, no thyromegaly, no nodes, no carotid bruits ?CARDIAC: RRR, S1S2+, no murmur. DP 2+B ?LUNGS: CTAB. No wheezes ?EXT:  no edema ?MSK: no gross abnormalities.  ?NEURO:  A&O x3.  CN II-XII intact.  ?PSYCH: normal mood. Good eye contact ? ?   ?Assessment & Plan:  ? ?Problem List Items Addressed This Visit   ?None ?Visit Diagnoses   ? ? Viral upper respiratory tract infection    -  Primary  ? Relevant Medications  ? azithromycin (ZITHROMAX) 250 MG tablet  ? ?  ? URI-pt smoker and some insp pain-will tx zpk.  Off work 2 days.  Check covid tomorrow . Wear mask, etc.  ? ?Meds ordered this encounter  ?Medications  ? azithromycin (ZITHROMAX) 250 MG tablet  ?  Sig: Take 2 tablets on day 1, then 1 tablet daily on days 2 through 5  ?  Dispense:  6 tablet  ?  Refill:  0  ? benzonatate (TESSALON PERLES) 100 MG capsule  ?  Sig: Take 1 capsule (100 mg total) by mouth 3 (three) times daily as needed.  ?  Dispense:  20 capsule  ?  Refill:  0  ? ? ?06/09/21, MD ? ?

## 2022-03-04 ENCOUNTER — Telehealth: Payer: Self-pay | Admitting: Family Medicine

## 2022-03-04 NOTE — Telephone Encounter (Signed)
Okay to write note? Please advise. 

## 2022-03-04 NOTE — Telephone Encounter (Signed)
Patient needs a work note as she is still not feeling better.- She would like to return to work on 3/31- can you please add a letter? Patient would like a call when done.  ?

## 2022-03-05 ENCOUNTER — Encounter: Payer: Self-pay | Admitting: Family Medicine

## 2022-03-05 NOTE — Telephone Encounter (Signed)
Left message to return my call.  

## 2022-03-05 NOTE — Telephone Encounter (Signed)
Letter completed and uploaded in mychart.  ?

## 2022-04-20 ENCOUNTER — Encounter: Payer: Self-pay | Admitting: Family

## 2022-04-20 ENCOUNTER — Encounter: Payer: Self-pay | Admitting: Family Medicine

## 2022-04-20 ENCOUNTER — Ambulatory Visit (INDEPENDENT_AMBULATORY_CARE_PROVIDER_SITE_OTHER): Payer: 59 | Admitting: Family

## 2022-04-20 ENCOUNTER — Other Ambulatory Visit (HOSPITAL_COMMUNITY)
Admission: RE | Admit: 2022-04-20 | Discharge: 2022-04-20 | Disposition: A | Discharge status: Home / Self Care | Payer: No Typology Code available for payment source | Source: Ambulatory Visit | Attending: Family | Admitting: Family

## 2022-04-20 VITALS — BP 101/63 | HR 75 | Temp 98.5°F | Ht 63.0 in | Wt 102.1 lb

## 2022-04-20 DIAGNOSIS — N898 Other specified noninflammatory disorders of vagina: Secondary | ICD-10-CM

## 2022-04-20 DIAGNOSIS — N946 Dysmenorrhea, unspecified: Secondary | ICD-10-CM

## 2022-04-20 DIAGNOSIS — N940 Mittelschmerz: Secondary | ICD-10-CM | POA: Diagnosis present

## 2022-04-20 MED ORDER — KETOPROFEN 50 MG PO CAPS: 50.0000 mg | ORAL_CAPSULE | Freq: Three times a day (TID) | ORAL | 0 refills | Status: DC | PRN

## 2022-04-20 MED ORDER — MEDROXYPROGESTERONE ACETATE 5 MG PO TABS: 5.0000 mg | ORAL_TABLET | Freq: Every day | ORAL | 0 refills | Status: DC

## 2022-04-20 NOTE — Patient Instructions (Addendum)
It was very nice to see you today! ? ?Go to the lab today and leave a urine specimen for STD testing. ? ?I have sent 2 medications to start taking to help with your menstrual pain & bleeding. Take 1 pill of the Medroxyprogesterone daily until no longer bleeding, but not longer than 7 days.  ?The other pill is Ketoprofen to help with pain, follow directions on the bottle. ?Follow up with Dr. Mardelle Matte about restarting DEPO shots. ? ?I also have sent a referral to Gynecology to drain the vaginal cyst - they will call you to schedule. ? ? ? ?PLEASE NOTE: ? ?If you had any lab tests please let us know if you have not heard back within a few days. You may see your results on MyChart before we have a chance to review them but we will give you a call once they are reviewed by [place]. If we ordered any referrals today, please let us know if you have not heard from their office within the next week.  ? ?

## 2022-04-20 NOTE — Progress Notes (Signed)
? ?Subjective:  ? ? ? Patient ID: Kristine Holder, female    DOB: [birth year], 46 y.o.   MRN: [id number] ? ?Chief Complaint  ?Patient presents with  ? Menstrual Problem  ?  Pt c/o severe cramping and bleeding after having a cycle already this month. Has been taking ibuprofen which does not help at all. 2nd started on 5/13.  ? Cyst  ?  Pt c/o a bump on vagina.Just noticed and has a new sexual partner. Would like STD testing as well.   ? ?HPI: ?Irregular Menstruation: Patient complains of irregular menses. LMP 04/18/2022 ?Periods are lasting  7 days.  ?Missed periods no. ?Dysmenorrhea: yes. ?Menstrual symptoms include heavy, very painful, denies moodiness or constipation. ?Current contraception: none. Stopped DEPO last year, normal cycles until this month.   ?History of abnormal Pap smear: yes - LEEP in 2008 ?Skin cyst: Patient complains of a skin cyst of the  vagina . The lesion has been present for 1 week. Lesion has not changed in  that time. Symptoms associated with the lesion are: none. Patient denies increasing diameter, darkening color, itching, bleeding, pain, drainage, infection. ? ?Assessment & Plan:  ? ?Problem List Items Addressed This Visit   ?None ?Visit Diagnoses   ? ? Dysmenorrhea    -  Primary  ? Reports stopping DEPO shots last year, has had regular cycles until this month and had one starting 5/1 lasting 7 days, then restarted again 2 days ago with heavy bleeding and severe cramping. Sending Provera and Ketoprofen, pt advised on use & SE, pt will discuss restarting DEPO shots with PCP. ? ?Relevant Medications  ? ketoprofen (ORUDIS) 50 MG capsule  ? medroxyPROGESTERone (PROVERA) 5 MG tablet  ? Other Relevant Orders  ? Urine cytology ancillary only([place])  ? Vaginal cyst      ? Sebaceous, Non-tender, not inflamed, right outer labia. ? ?Relevant Orders  ? Ambulatory referral to Gynecology  ? Urine cytology ancillary only([place])  ? ?  ? ?Outpatient Medications Prior to Visit  ?Medication Sig  Dispense Refill  ? acetaminophen (TYLENOL) 325 MG tablet Take 2 tablets (650 mg total) by mouth every 6 (six) hours as needed for up to 30 doses for moderate pain or mild pain. 30 tablet 0  ? ALPRAZolam (XANAX) 0.25 MG tablet TAKE 1 TABLET BY MOUTH DAILY AS NEEDED FOR ANXIETY 30 tablet 2  ? clindamycin (CLEOCIN-T) 1 % external solution Apply topically 2 (two) times daily. 60 mL 3  ? escitalopram (LEXAPRO) 20 MG tablet Take 1 tablet (20 mg total) by mouth daily. 90 tablet 0  ? ibuprofen (ADVIL) 600 MG tablet TAKE 1 TABLET BY MOUTH EVERY 6 HOURS AS NEEDED FOR MILD PAIN OR MODERATE PAIN.    ? methocarbamol (ROBAXIN) 500 MG tablet Take 1 tablet (500 mg total) by mouth 2 (two) times daily as needed for muscle spasms. 30 tablet 0  ? benzonatate (TESSALON PERLES) 100 MG capsule Take 1 capsule (100 mg total) by mouth 3 (three) times daily as needed. (Patient not taking: Reported on 04/20/2022) 20 capsule 0  ? ?No facility-administered medications prior to visit.  ? ? ?Past Medical History:  ?Diagnosis Date  ? Dependence on nicotine from cigarettes 05/17/2018  ? History of abnormal cervical Pap smear 05/17/2018  ? H/o LEEP 2008 with nl f/u until 2015: nl pap with + HR HPV - no follow up.  ? Human papilloma virus (HPV) infection   ? Irritable bowel syndrome (IBS) 05/17/2018  ? Migraine   ? ? ?  Past Surgical History:  ?Procedure Laterality Date  ? LEEP  2008  ? ? ?Allergies  ?Allergen Reactions  ? Ciprofloxacin Hcl Nausea And Vomiting  ?  Other reaction(s): Other (See Comments)  ? Prednisone Other (See Comments)  ?  Makes patient feel loopy ?Other reaction(s): Other (See Comments) ?Makes patient feel loopy ?Makes patient feel loopy  ? ? ?   ?Objective:  ?  ?Physical Exam ?Vitals and nursing note reviewed.  ?Constitutional:   ?   Appearance: Normal appearance.  ?Cardiovascular:  ?   Rate and Rhythm: Normal rate and regular rhythm.  ?Pulmonary:  ?   Effort: Pulmonary effort is normal.  ?   Breath sounds: Normal breath sounds.   ?Genitourinary: ?   Labia:     ?   Right: Lesion (firm 2cm diameter sebaceous cyst on right labia majora) present.   ? ? ?Musculoskeletal:     ?   General: Normal range of motion.  ?Skin: ?   General: Skin is warm and dry.  ?Neurological:  ?   Mental Status: She is alert.  ?Psychiatric:     ?   Mood and Affect: Mood normal.     ?   Behavior: Behavior normal.  ? ? ?BP 101/63 (BP Location: Left Arm, Patient Position: Sitting, Cuff Size: Normal)   Pulse 75   Temp 98.5 ?F (36.9 ?C) (Temporal)   Ht 5\' 3"  (1.6 m)   Wt 102 lb 2 oz (46.3 kg)   LMP 04/06/2022   SpO2 98%   BMI 18.09 kg/m?  ?Wt Readings from Last 3 Encounters:  ?04/20/22 102 lb 2 oz (46.3 kg)  ?03/03/22 104 lb 6 oz (47.3 kg)  ?06/11/21 107 lb (48.5 kg)  ? ? ?   ? ?Meds ordered this encounter  ?Medications  ? ketoprofen (ORUDIS) 50 MG capsule  ?  Sig: Take 1-2 capsules (50-100 mg total) by mouth 3 (three) times daily as needed for moderate pain (After meals). Do NOT exceed 6 pills in 24 hours.  ?  Dispense:  30 capsule  ?  Refill:  0  ?  Order Specific Question:   Supervising Provider  ?  Answer:   [name], CAMILLE L [[id number]]  ? medroxyPROGESTERone (PROVERA) 5 MG tablet  ?  Sig: Take 1-2 tablets (5-10 mg total) by mouth daily.  ?  Dispense:  15 tablet  ?  Refill:  0  ?  Order Specific Question:   Supervising Provider  ?  Answer:   [name], CAMILLE L [[id number]]  ? ? ?08/12/21, NP ? ?

## 2022-04-22 LAB — URINE CYTOLOGY ANCILLARY ONLY
Bacterial Vaginitis-Urine: NEGATIVE
Candida Urine: NEGATIVE
Chlamydia: NEGATIVE
Comment: NEGATIVE
Comment: NEGATIVE
Comment: NORMAL
Neisseria Gonorrhea: NEGATIVE
Trichomonas: NEGATIVE

## 2022-04-22 NOTE — Progress Notes (Signed)
Hi Meggan, ? ?Your urine STD results are negative. ? ?Take care.

## 2022-04-28 ENCOUNTER — Ambulatory Visit (INDEPENDENT_AMBULATORY_CARE_PROVIDER_SITE_OTHER): Payer: No Typology Code available for payment source

## 2022-04-28 ENCOUNTER — Ambulatory Visit: Payer: No Typology Code available for payment source | Admitting: Family Medicine

## 2022-04-28 ENCOUNTER — Encounter: Payer: No Typology Code available for payment source | Admitting: Nurse Practitioner

## 2022-04-28 ENCOUNTER — Encounter: Payer: Self-pay | Admitting: Family

## 2022-04-28 ENCOUNTER — Ambulatory Visit (INDEPENDENT_AMBULATORY_CARE_PROVIDER_SITE_OTHER): Payer: 59 | Admitting: Family

## 2022-04-28 VITALS — BP 106/68 | HR 62 | Temp 98.2°F | Ht 63.0 in | Wt 103.1 lb

## 2022-04-28 DIAGNOSIS — Z3042 Encounter for surveillance of injectable contraceptive: Secondary | ICD-10-CM | POA: Diagnosis not present

## 2022-04-28 DIAGNOSIS — N946 Dysmenorrhea, unspecified: Secondary | ICD-10-CM | POA: Diagnosis not present

## 2022-04-28 MED ORDER — MEDROXYPROGESTERONE ACETATE 150 MG/ML IM SUSP
150.0000 mg | Freq: Once | INTRAMUSCULAR | Status: DC
  Administered 2022-04-28: 150 mg via INTRAMUSCULAR

## 2022-04-28 MED ORDER — MEDROXYPROGESTERONE ACETATE 150 MG/ML IM SUSP: 150.0000 mg | Freq: Once | INTRAMUSCULAR | 0 refills | Status: DC

## 2022-04-28 NOTE — Progress Notes (Signed)
Subjective:     Patient ID: Kristine Holder, female    DOB: [birth year], 46 y.o.   MRN: [id number]  Chief Complaint  Patient presents with   Menorrhagia    Follow up, Pt c/o heavy menstruation, back pain and cramping. Cycle started Saturday on 5/20 lightly and has got heavier since.    Contraception    Discuss getting back on depo. Been off since April 2022.   HPI: Irregular Menstruation: Patient complains of irregular menses. LMP 04/18/2022 and again on 04/25/2022 and still having currently. Periods are lasting  7 days.  Missed periods no. Dysmenorrhea: yes. Menstrual symptoms include heavy, very painful, denies moodiness or constipation. Current contraception: none. Stopped DEPO last year, normal cycles until this month.   History of abnormal Pap smear: yes - LEEP in 2008  Assessment & Plan:   Problem List Items Addressed This Visit       Other   Encounter for Depo-Provera contraception - Primary no DEPO shots in office, pt will go pick up from pharmacy and return to office for injection.    Relevant Medications   medroxyPROGESTERone (DEPO-PROVERA) 150 MG/ML injection   Other Visit Diagnoses     Dysmenorrhea    - pt has started bleeding again, states she took the progesterone pills with her last cycle which seemed to help, but did not start them this time. Reports pharmacy not having the Ketprofen pills, she has been taking Ibuprofen. Scheduled to receive DEPO shot tomorrow, will go ahead and give today, advised to not take any more progesterone pills. OK to take up to 800mg  Ibuprofen tid after eating.      Outpatient Medications Prior to Visit  Medication Sig Dispense Refill   acetaminophen (TYLENOL) 325 MG tablet Take 2 tablets (650 mg total) by mouth every 6 (six) hours as needed for up to 30 doses for moderate pain or mild pain. 30 tablet 0   ALPRAZolam (XANAX) 0.25 MG tablet TAKE 1 TABLET BY MOUTH DAILY AS NEEDED FOR ANXIETY 30 tablet 2   clindamycin (CLEOCIN-T) 1  % external solution Apply topically 2 (two) times daily. 60 mL 3   escitalopram (LEXAPRO) 20 MG tablet Take 1 tablet (20 mg total) by mouth daily. 90 tablet 0   ibuprofen (ADVIL) 600 MG tablet TAKE 1 TABLET BY MOUTH EVERY 6 HOURS AS NEEDED FOR MILD PAIN OR MODERATE PAIN.     ketoprofen (ORUDIS) 50 MG capsule Take 1-2 capsules (50-100 mg total) by mouth 3 (three) times daily as needed for moderate pain (After meals). Do NOT exceed 6 pills in 24 hours. 30 capsule 0   medroxyPROGESTERone (PROVERA) 5 MG tablet Take 1-2 tablets (5-10 mg total) by mouth daily. 15 tablet 0   No facility-administered medications prior to visit.    Past Medical History:  Diagnosis Date   Dependence on nicotine from cigarettes 05/17/2018   History of abnormal cervical Pap smear 05/17/2018   H/o LEEP 2008 with nl f/u until 2015: nl pap with + HR HPV - no follow up.   Human papilloma virus (HPV) infection    Irritable bowel syndrome (IBS) 05/17/2018   Migraine     Past Surgical History:  Procedure Laterality Date   LEEP  2008    Allergies  Allergen Reactions   Ciprofloxacin Hcl Nausea And Vomiting    Other reaction(s): Other (See Comments)   Prednisone Other (See Comments)    Makes patient feel loopy Other reaction(s): Other (See Comments) Makes patient feel loopy Makes  patient feel loopy       Objective:    Physical Exam Vitals and nursing note reviewed.  Constitutional:      Appearance: Normal appearance.  Cardiovascular:     Rate and Rhythm: Normal rate and regular rhythm.  Pulmonary:     Effort: Pulmonary effort is normal.     Breath sounds: Normal breath sounds.  Musculoskeletal:        General: Normal range of motion.  Skin:    General: Skin is warm and dry.  Neurological:     Mental Status: She is alert.  Psychiatric:        Mood and Affect: Mood normal.        Behavior: Behavior normal.    BP 106/68 (BP Location: Left Arm, Patient Position: Sitting, Cuff Size: Normal)   Pulse 62    Temp 98.2 F (36.8 C) (Temporal)   Ht 5\' 3"  (1.6 m)   Wt 103 lb 2 oz (46.8 kg)   LMP 04/06/2022   SpO2 99%   BMI 18.27 kg/m  Wt Readings from Last 3 Encounters:  04/28/22 103 lb 2 oz (46.8 kg)  04/20/22 102 lb 2 oz (46.3 kg)  03/03/22 104 lb 6 oz (47.3 kg)    Meds ordered this encounter  Medications   DISCONTD: medroxyPROGESTERone (DEPO-PROVERA) injection 150 mg   medroxyPROGESTERone (DEPO-PROVERA) 150 MG/ML injection    Sig: Inject 1 mL (150 mg total) into the muscle once for 1 dose.    Dispense:  1 mL    Refill:  0    Order Specific Question:   Supervising Provider    Answer:   [name], CAMILLE L [[id number]]    03/05/22, NP

## 2022-04-29 ENCOUNTER — Ambulatory Visit: Payer: No Typology Code available for payment source | Admitting: Family Medicine

## 2022-04-29 ENCOUNTER — Telehealth: Payer: Self-pay | Admitting: Family Medicine

## 2022-04-29 NOTE — Telephone Encounter (Signed)
Pt is wondering if something else could be going on with condition. Pt states she hasn't been swabbed or "been checked" by the other available provider in the practice  Pt explicitly asks for further investigation to determine cause for bleeding and treatment.  Pt stated she had to call out of work because of the condition and will be available the whole day. Pt is will to come back in or make an appointment where directed by PCP.  Pt states she needs a call back.

## 2022-04-29 NOTE — Progress Notes (Signed)
Patient did not respond to question prior to end of shift. Encounter closed

## 2022-04-30 ENCOUNTER — Encounter: Payer: Self-pay | Admitting: Obstetrics and Gynecology

## 2022-04-30 ENCOUNTER — Ambulatory Visit (INDEPENDENT_AMBULATORY_CARE_PROVIDER_SITE_OTHER): Payer: 59

## 2022-04-30 ENCOUNTER — Telehealth: Payer: Self-pay

## 2022-04-30 ENCOUNTER — Ambulatory Visit (INDEPENDENT_AMBULATORY_CARE_PROVIDER_SITE_OTHER): Payer: 59 | Admitting: Obstetrics and Gynecology

## 2022-04-30 ENCOUNTER — Other Ambulatory Visit (HOSPITAL_COMMUNITY)
Admission: RE | Admit: 2022-04-30 | Discharge: 2022-04-30 | Disposition: A | Discharge status: Home / Self Care | Payer: 59 | Source: Ambulatory Visit | Attending: Obstetrics and Gynecology | Admitting: Obstetrics and Gynecology

## 2022-04-30 VITALS — BP 110/68 | HR 67 | Ht 63.0 in | Wt 104.0 lb

## 2022-04-30 DIAGNOSIS — R102 Pelvic and perineal pain: Secondary | ICD-10-CM

## 2022-04-30 DIAGNOSIS — R109 Unspecified abdominal pain: Secondary | ICD-10-CM

## 2022-04-30 DIAGNOSIS — Z1211 Encounter for screening for malignant neoplasm of colon: Secondary | ICD-10-CM

## 2022-04-30 DIAGNOSIS — N6312 Unspecified lump in the right breast, upper inner quadrant: Secondary | ICD-10-CM | POA: Diagnosis not present

## 2022-04-30 DIAGNOSIS — N3946 Mixed incontinence: Secondary | ICD-10-CM

## 2022-04-30 DIAGNOSIS — Z113 Encounter for screening for infections with a predominantly sexual mode of transmission: Secondary | ICD-10-CM | POA: Diagnosis present

## 2022-04-30 DIAGNOSIS — Z124 Encounter for screening for malignant neoplasm of cervix: Secondary | ICD-10-CM | POA: Diagnosis present

## 2022-04-30 DIAGNOSIS — N730 Acute parametritis and pelvic cellulitis: Secondary | ICD-10-CM | POA: Diagnosis not present

## 2022-04-30 DIAGNOSIS — Z01419 Encounter for gynecological examination (general) (routine) without abnormal findings: Secondary | ICD-10-CM | POA: Diagnosis not present

## 2022-04-30 DIAGNOSIS — N898 Other specified noninflammatory disorders of vagina: Secondary | ICD-10-CM

## 2022-04-30 DIAGNOSIS — B9689 Other specified bacterial agents as the cause of diseases classified elsewhere: Secondary | ICD-10-CM

## 2022-04-30 DIAGNOSIS — N760 Acute vaginitis: Secondary | ICD-10-CM

## 2022-04-30 LAB — URINALYSIS, COMPLETE
Bilirubin Urine: NEGATIVE
Casts: NONE SEEN /LPF
Crystals: NONE SEEN /HPF
Glucose, UA: NEGATIVE
Hgb urine dipstick: NEGATIVE
Hyaline Cast: NONE SEEN /LPF
Ketones, ur: NEGATIVE
Nitrite: NEGATIVE
Protein, ur: NEGATIVE
RBC / HPF: NONE SEEN /HPF (ref 0–2)
Specific Gravity, Urine: 1.008 (ref 1.001–1.035)
Yeast: NONE SEEN /HPF
pH: 5.5 (ref 5.0–8.0)

## 2022-04-30 LAB — WET PREP FOR TRICH, YEAST, CLUE

## 2022-04-30 MED ORDER — CEFTRIAXONE SODIUM 500 MG IJ SOLR
500.0000 mg | Freq: Once | INTRAMUSCULAR | Status: AC
  Administered 2022-04-30: 500 mg via INTRAMUSCULAR

## 2022-04-30 MED ORDER — DOXYCYCLINE HYCLATE 100 MG PO CAPS: 100.0000 mg | ORAL_CAPSULE | Freq: Two times a day (BID) | ORAL | 0 refills | Status: DC

## 2022-04-30 MED ORDER — METRONIDAZOLE 500 MG PO TABS: 500.0000 mg | ORAL_TABLET | Freq: Two times a day (BID) | ORAL | 0 refills | Status: DC

## 2022-04-30 NOTE — Progress Notes (Signed)
46 y.o. [id number] Single White or Caucasian Not Hispanic or Latino female here for abnormal bleeding and a vaginal cyst. She is having lower abdomenal cramping.  She went off depo in April on 2022 and did not have a cycle until about Feb. She recently restarted Depo last week. She has also been bleeding on and off for about 3 weeks.  She was on depo-provera for over 10 years, went off of it 4/22 because she wasn't sexually active. Started bleeding irregularly in 2/23. She has been bleeding off and on for the last 3 weeks. In the last 3 weeks she is having intermittent severe, BLQ abdominal pain. The pain is sharp/aching/cramping, comes and goes over the course of a few minutes. Coming and going all day. Minimal help with ibuprofen, heating pads help. Normal BM daily.  Some urinary frequency, small to normal amounts.  She has mixed incontinence, small amounts a couple of times a week.   She was restarted on Depo last week.  Sexually active, new partner. Negative urine for STD's last week.  No dyspareunia.  Period Duration (Days): 5-7 (She is having breakthrough bleeding weekly. it starts out light then is moderate with red blood) Period Pattern: (!) Irregular Menstrual Flow: Light Menstrual Control: Tampon Menstrual Control Change Freq (Hours): 4 Dysmenorrhea: (!) Severe (She states that she misses work because of it.) Dysmenorrhea Symptoms: Cramping, Headache  Patient's last menstrual period was 04/06/2022.          Sexually active: Yes.    The current method of family planning is Depo-Provera injections.    Exercising: Yes.    The patient does not participate in regular exercise at present. Smoker:  yes, <1PPD  Health Maintenance: Pap:  05/31/2018 WNL HR HPV Neg  History of abnormal Pap:  yes HX LEEP 20+ years ago MMG:  none  BMD:   none  Colonoscopy: none  TDaP:  none, declines  Gardasil: n/a   reports that she has been smoking cigarettes. She has a 24.00 pack-year smoking history. She  has never used smokeless tobacco. She reports current alcohol use. She reports current drug use. Drug: Marijuana. 4-6 drinks a week. She works at [employer]. Son is 21.   Past Medical History:  Diagnosis Date   Anxiety    Dependence on nicotine from cigarettes 05/17/2018   Depression    Dysmenorrhea    History of abnormal cervical Pap smear 05/17/2018   H/o LEEP 2008 with nl f/u until 2015: nl pap with + HR HPV - no follow up.   Human papilloma virus (HPV) infection    Irritable bowel syndrome (IBS) 05/17/2018   Migraine    PID (pelvic inflammatory disease)     Past Surgical History:  Procedure Laterality Date   CERVICAL BIOPSY  W/ LOOP ELECTRODE EXCISION     LEEP  2008    Current Outpatient Medications  Medication Sig Dispense Refill   acetaminophen (TYLENOL) 325 MG tablet Take 2 tablets (650 mg total) by mouth every 6 (six) hours as needed for up to 30 doses for moderate pain or mild pain. 30 tablet 0   ALPRAZolam (XANAX) 0.25 MG tablet TAKE 1 TABLET BY MOUTH DAILY AS NEEDED FOR ANXIETY 30 tablet 2   clindamycin (CLEOCIN-T) 1 % external solution Apply topically 2 (two) times daily. 60 mL 3   escitalopram (LEXAPRO) 20 MG tablet Take 1 tablet (20 mg total) by mouth daily. 90 tablet 0   medroxyPROGESTERone (DEPO-PROVERA) 150 MG/ML injection Inject 1 mL (150  mg total) into the muscle once for 1 dose. 1 mL 0   No current facility-administered medications for this visit.    Family History  Problem Relation Age of Onset   Hypertension Father    Kidney disease Mother    Learning disabilities Mother    Healthy Son    Alcohol abuse Maternal Grandfather    COPD Paternal Grandmother    Alcohol abuse Paternal Grandfather     Review of Systems  Genitourinary:  Positive for dysuria, flank pain, frequency, menstrual problem, pelvic pain, urgency, vaginal bleeding, vaginal discharge and vaginal pain.   Exam:   BP 110/68   Pulse 67   Ht 5\' 3"  (1.6 m)   Wt 104 lb (47.2 kg)   LMP  04/06/2022   SpO2 99%   BMI 18.42 kg/m   Weight change: @WEIGHTCHANGE @ Height:   Height: 5\' 3"  (160 cm)  Ht Readings from Last 3 Encounters:  04/30/22 5\' 3"  (1.6 m)  04/28/22 5\' 3"  (1.6 m)  04/20/22 5\' 3"  (1.6 m)    General appearance: alert, cooperative and appears stated age Head: Normocephalic, without obvious abnormality, atraumatic Neck: no adenopathy, supple, symmetrical, trachea midline and thyroid normal to inspection and palpation Lungs: clear to auscultation bilaterally Cardiovascular: regular rate and rhythm Breasts:  in the right breast at 1 o'clock is an ~ 2 cm, smooth mobile lump. No other lumps Abdomen: soft, BLQ tenderness, no rebound, no guarding non distended,  no masses,  no organomegaly Extremities: extremities normal, atraumatic, no cyanosis or edema Skin: Skin color, texture, turgor normal. No rashes or lesions Lymph nodes: Cervical, supraclavicular, and axillary nodes normal. No abnormal inguinal nodes palpated Neurologic: Grossly normal   Pelvic: External genitalia:  no lesions              Urethra:  normal appearing urethra with no masses, tenderness or lesions              Bartholins and Skenes: normal                 Vagina: normal appearing vagina with normal color and discharge, no lesions              Cervix: cervical motion tenderness and no lesions               Bimanual Exam:  Uterus:   uterus is mobile, normal sized and tender              Adnexa:  no masses, tender bilaterally                Rectovaginal: Confirms               Anus:  normal sphincter tone, no lesions  Carolynn Serveerra Myers chaperoned for the exam.  1. Well woman exam Discussed breast self exam Discussed calcium and vit D intake  2. Screening for cervical cancer - Cytology - PAP  3. Combined abdominal and pelvic pain - CBC with Differential/Platelet - US PELVIS TRANSVAGINAL NON-OB (TV ONLY); Future  4. Colon cancer screening - Ambulatory referral to Gastroenterology  5.  Mixed incontinence Information givne - Urinalysis, Complete - Urine Culture  6. Screening examination for STD (sexually transmitted disease) - RPR - HIV Antibody (routine testing w rflx) - Hepatitis C antibody - Cytology - PAP  7. PID (acute pelvic inflammatory disease) Clinically meets criteria for diagnosis - cefTRIAXone (ROCEPHIN) injection 500 mg - metroNIDAZOLE (FLAGYL) 500 MG tablet; Take 1 tablet (500 mg total) by  mouth 2 (two) times daily.  Dispense: 28 tablet; Refill: 0 - doxycycline (VIBRAMYCIN) 100 MG capsule; Take 1 capsule (100 mg total) by mouth 2 (two) times daily. Take BID for 14 days.  Take with food as can cause GI distress.  Dispense: 28 capsule; Refill: 0  8. Breast lump on right side at 1 o'clock position Diagnostic imaging set up   9. Vaginal discharge - WET PREP FOR TRICH, YEAST, CLUE  10. Bacterial vaginitis Treat with flagyl

## 2022-04-30 NOTE — Telephone Encounter (Signed)
Kristine Dom, MD  [facility] Triage She needs diagnostic right breast imaging  Lump at 1

## 2022-04-30 NOTE — Telephone Encounter (Signed)
error 

## 2022-04-30 NOTE — Patient Instructions (Signed)
Urinary Incontinence Urinary incontinence refers to a condition in which a person is unable to control where and when to pass urine. A person with this condition will urinate involuntarily. This means that the person urinates when he or she does not mean to. What are the causes? This condition may be caused by: Medicines. Infections. Constipation. Overactive bladder muscles. Weak bladder muscles. Weak pelvic floor muscles. These muscles provide support for the bladder, intestine, and, in women, the uterus. Enlarged prostate in men. The prostate is a gland near the bladder. When it gets too big, it can pinch the urethra. With the urethra blocked, the bladder can weaken and lose the ability to empty properly. Surgery. Emotional factors, such as anxiety, stress, or post-traumatic stress disorder (PTSD). Spinal cord injury, nerve injury, or other neurological conditions. Pelvic organ prolapse. This happens in women when organs move out of place and into the vagina. This movement can prevent the bladder and urethra from working properly. What increases the risk? The following factors may make you more likely to develop this condition: Age. The older you are, the higher the risk. Obesity. Being physically inactive. Pregnancy and childbirth. Menopause. Diseases that affect the nerves or spinal cord. Long-term, or chronic, coughing. This can increase pressure on the bladder and pelvic floor muscles. What are the signs or symptoms? Symptoms may vary depending on the type of urinary incontinence you have. They include: A sudden urge to urinate, and passing urine involuntarily before you can get to a bathroom (urge incontinence). Suddenly passing urine when doing activities that force urine to pass, such as coughing, laughing, exercising, or sneezing (stress incontinence). Needing to urinate often but urinating only a small amount, or constantly dribbling urine (overflow incontinence). Urinating  because you cannot get to the bathroom in time due to a physical disability, such as arthritis or injury, or due to a communication or thinking problem, such as Alzheimer's disease (functional incontinence). How is this diagnosed? This condition may be diagnosed based on: Your medical history. A physical exam. Tests, such as: Urine tests. X-rays of your kidney and bladder. Ultrasound. CT scan. Cystoscopy. In this procedure, a health care provider inserts a tube with a light and camera (cystoscope) through the urethra and into the bladder to check for problems. Urodynamic testing. These tests assess how well the bladder, urethra, and sphincter can store and release urine. There are different types of urodynamic tests, and they vary depending on what the test is measuring. To help diagnose your condition, your health care provider may recommend that you keep a log of when you urinate and how much you urinate. How is this treated? Treatment for this condition depends on the type of incontinence that you have and its cause. Treatment may include: Lifestyle changes, such as: Quitting smoking. Maintaining a healthy weight. Staying active. Try to get 150 minutes of moderate-intensity exercise every week. Ask your health care provider which activities are safe for you. Eating a healthy diet. Avoid high-fat foods, like fried foods. Avoid refined carbohydrates like white bread and white rice. Limit how much alcohol and caffeine you drink. Increase your fiber intake. Healthy sources of fiber include beans, whole grains, and fresh fruits and vegetables. Behavioral changes, such as: Pelvic floor muscle exercises. Bladder training, such as lengthening the amount of time between bathroom breaks, or using the bathroom at regular intervals. Using techniques to suppress bladder urges. This can include distraction techniques or controlled breathing exercises. Medicines, such as: Medicines to relax the  bladder   muscles and prevent bladder spasms. Medicines to help slow or prevent the growth of a man's prostate. Botox injections. These can help relax the bladder muscles. Treatments, such as: Using pulses of electricity to help change bladder reflexes (electrical nerve stimulation). For women, using a medical device to prevent urine leaks. This is a small, tampon-like, disposable device that is inserted into the urethra. Injecting collagen or carbon beads (bulking agents) into the urinary sphincter. These can help thicken tissue and close the bladder opening. Surgery. Follow these instructions at home: Lifestyle Limit alcohol and caffeine. These can fill your bladder quickly and irritate it. Keep yourself clean to help prevent odors and skin damage. Ask your health care provider about special skin creams and cleansers that can protect the skin from urine. Consider wearing pads or adult diapers. Make sure to change them regularly, and always change them right after experiencing incontinence. General instructions Take over-the-counter and prescription medicines only as told by your health care provider. Use the bathroom about every 3-4 hours, even if you do not feel the need to urinate. Try to empty your bladder completely every time. After urinating, wait a minute. Then try to urinate again. Make sure you are in a relaxed position while urinating. If your incontinence is caused by nerve problems, keep a log of the medicines you take and the times you go to the bathroom. Keep all follow-up visits. This is important. Where to find more information [employer] of Diabetes and Digestive and Kidney Diseases: [employer] American Urology Association: www.urologyhealth.org Contact a health care provider if: You have pain that gets worse. Your incontinence gets worse. Get help right away if: You have a fever or chills. You are unable to urinate. You have redness in your groin area or  down your legs. Summary Urinary incontinence refers to a condition in which a person is unable to control where and when to pass urine. This condition may be caused by medicines, infection, weak bladder muscles, weak pelvic floor muscles, enlargement of the prostate (in men), or surgery. Factors such as older age, obesity, pregnancy and childbirth, menopause, neurological diseases, and chronic coughing may increase your risk for developing this condition. Types of urinary incontinence include urge incontinence, stress incontinence, overflow incontinence, and functional incontinence. This condition is usually treated first with lifestyle and behavioral changes, such as quitting smoking, eating a healthier diet, and doing regular pelvic floor exercises. Other treatment options include medicines, bulking agents, medical devices, electrical nerve stimulation, or surgery. This information is not intended to replace advice given to you by your health care provider. Make sure you discuss any questions you have with your health care provider. Document Revised: 06/28/2020 Document Reviewed: 06/28/2020 Elsevier Patient Education  [place]. Kegel Exercises  Kegel exercises can help strengthen your pelvic floor muscles. The pelvic floor is a group of muscles that support your rectum, small intestine, and bladder. In females, pelvic floor muscles also help support the uterus. These muscles help you control the flow of urine and stool (feces). Kegel exercises are painless and simple. They do not require any equipment. Your provider may suggest Kegel exercises to: Improve bladder and bowel control. Improve sexual response. Improve weak pelvic floor muscles after surgery to remove the uterus (hysterectomy) or after pregnancy, in females. Improve weak pelvic floor muscles after prostate gland removal or surgery, in males. Kegel exercises involve squeezing your pelvic floor muscles. These are the same  muscles you squeeze when you try to stop the flow of  urine or keep from passing gas. The exercises can be done while sitting, standing, or lying down, but it is best to vary your position. Ask your health care provider which exercises are safe for you. Do exercises exactly as told by your health care provider and adjust them as directed. Do not begin these exercises until told by your health care provider. Exercises How to do Kegel exercises: Squeeze your pelvic floor muscles tight. You should feel a tight lift in your rectal area. If you are a female, you should also feel a tightness in your vaginal area. Keep your stomach, buttocks, and legs relaxed. Hold the muscles tight for up to 10 seconds. Breathe normally. Relax your muscles for up to 10 seconds. Repeat as told by your health care provider. Repeat this exercise daily as told by your health care provider. Continue to do this exercise for at least 4-6 weeks, or for as long as told by your health care provider. You may be referred to a physical therapist who can help you learn more about how to do Kegel exercises. Depending on your condition, your health care provider may recommend: Varying how long you squeeze your muscles. Doing several sets of exercises every day. Doing exercises for several weeks. Making Kegel exercises a part of your regular exercise routine. This information is not intended to replace advice given to you by your health care provider. Make sure you discuss any questions you have with your health care provider. Document Revised: 04/03/2021 Document Reviewed: 04/03/2021 Elsevier Patient Education  2023 Elsevier Inc.  EXERCISE   We recommended that you start or continue a regular exercise program for good health. Physical activity is anything that gets your body moving, some is better than none. The CDC recommends 150 minutes per week of Moderate-Intensity Aerobic Activity and 2 or more days of Muscle Strengthening  Activity.  Benefits of exercise are limitless: helps weight loss/weight maintenance, improves mood and energy, helps with depression and anxiety, improves sleep, tones and strengthens muscles, improves balance, improves bone density, protects from chronic conditions such as heart disease, high blood pressure and diabetes and so much more. To learn more visit: http://kirby-bean.org/  DIET: Good nutrition starts with a healthy diet of fruits, vegetables, whole grains, and lean protein sources. Drink plenty of water for hydration. Minimize empty calories, sodium, sweets. For more information about dietary recommendations visit: CriticalGas.be and https://www.carpenter-henry.info/  ALCOHOL:  Women should limit their alcohol intake to no more than 7 drinks/beers/glasses of wine (combined, not each!) per week. Moderation of alcohol intake to this level decreases your risk of breast cancer and liver damage.  If you are concerned that you may have a problem, or your friends have told you they are concerned about your drinking, there are many resources to help. A well-known program that is free, effective, and available to all people all over the nation is Alcoholics Anonymous.  Check out this site to learn more: BeverageBargains.co.za   CALCIUM AND VITAMIN D:  Adequate intake of calcium and Vitamin D are recommended for bone health.  You should be getting between 1000-1200 mg of calcium and 800 units of Vitamin D daily between diet and supplements  PAP SMEARS:  Pap smears, to check for cervical cancer or precancers,  have traditionally been done yearly, scientific advances have shown that most women can have pap smears less often.  However, every woman still should have a physical exam from her gynecologist every year. It will include a breast check, inspection  inspection of the vulva and vagina to check for abnormal growths or skin changes, a visual  exam of the cervix, and then an exam to evaluate the size and shape of the uterus and ovaries. We will also provide age appropriate advice regarding health maintenance, like when you should have certain vaccines, screening for sexually transmitted diseases, bone density testing, colonoscopy, mammograms, etc.   MAMMOGRAMS:  All women over 40 years old should have a routine mammogram.   COLON CANCER SCREENING: Now recommend starting at age 45. At this time colonoscopy is not covered for routine screening until 50. There are take home tests that can be done between 45-49.   COLONOSCOPY:  Colonoscopy to screen for colon cancer is recommended for all women at age 50.  We know, you hate the idea of the prep.  We agree, BUT, having colon cancer and not knowing it is worse!!  Colon cancer so often starts as a polyp that can be seen and removed at colonscopy, which can quite literally save your life!  And if your first colonoscopy is normal and you have no family history of colon cancer, most women don't have to have it again for 10 years.  Once every ten years, you can do something that may end up saving your life, right?  We will be happy to help you get it scheduled when you are ready.  Be sure to check your insurance coverage so you understand how much it will cost.  It may be covered as a preventative service at no cost, but you should check your particular policy.      Breast Self-Awareness Breast self-awareness means being familiar with how your breasts look and feel. It involves checking your breasts regularly and reporting any changes to your health care provider. Practicing breast self-awareness is important. A change in your breasts can be a sign of a serious medical problem. Being familiar with how your breasts look and feel allows you to find any problems early, when treatment is more likely to be successful. All women should practice breast self-awareness, including women who have had breast  implants. How to do a breast self-exam One way to learn what is normal for your breasts and whether your breasts are changing is to do a breast self-exam. To do a breast self-exam: Look for Changes  Remove all the clothing above your waist. Stand in front of a mirror in a room with good lighting. Put your hands on your hips. Push your hands firmly downward. Compare your breasts in the mirror. Look for differences between them (asymmetry), such as: Differences in shape. Differences in size. Puckers, dips, and bumps in one breast and not the other. Look at each breast for changes in your skin, such as: Redness. Scaly areas. Look for changes in your nipples, such as: Discharge. Bleeding. Dimpling. Redness. A change in position. Feel for Changes Carefully feel your breasts for lumps and changes. It is best to do this while lying on your back on the floor and again while sitting or standing in the shower or tub with soapy water on your skin. Feel each breast in the following way: Place the arm on the side of the breast you are examining above your head. Feel your breast with the other hand. Start in the nipple area and make  inch (2 cm) overlapping circles to feel your breast. Use the pads of your three middle fingers to do this. Apply light pressure, then medium pressure, then firm pressure.   light pressure will allow you to feel the tissue closest to the skin. The medium pressure will allow you to feel the tissue that is a little deeper. The firm pressure will allow you to feel the tissue close to the ribs. Continue the overlapping circles, moving downward over the breast until you feel your ribs below your breast. Move one finger-width toward the center of the body. Continue to use the  inch (2 cm) overlapping circles to feel your breast as you move slowly up toward your collarbone. Continue the up and down exam using all three pressures until you reach your armpit.  Write Down What  You Find  Write down what is normal for each breast and any changes that you find. Keep a written record with breast changes or normal findings for each breast. By writing this information down, you do not need to depend only on memory for size, tenderness, or location. Write down where you are in your menstrual cycle, if you are still menstruating. If you are having trouble noticing differences in your breasts, do not get discouraged. With time you will become more familiar with the variations in your breasts and more comfortable with the exam. How often should I examine my breasts? Examine your breasts every month. If you are breastfeeding, the best time to examine your breasts is after a feeding or after using a breast pump. If you menstruate, the best time to examine your breasts is 5-7 days after your period is over. During your period, your breasts are lumpier, and it may be more difficult to notice changes. When should I see my health care provider? See your health care provider if you notice: A change in shape or size of your breasts or nipples. A change in the skin of your breast or nipples, such as a reddened or scaly area. Unusual discharge from your nipples. A lump or thick area that was not there before. Pain in your breasts. Anything that concerns you.

## 2022-04-30 NOTE — Telephone Encounter (Signed)
Left message with patient to confirm she has not had mammogram or breast imaging previously. Asked her to call.

## 2022-05-01 ENCOUNTER — Ambulatory Visit: Payer: No Typology Code available for payment source | Admitting: Family Medicine

## 2022-05-01 LAB — CBC WITH DIFFERENTIAL/PLATELET
Absolute Monocytes: 972 cells/uL — ABNORMAL HIGH (ref 200–950)
Basophils Absolute: 79 cells/uL (ref 0–200)
Basophils Relative: 0.7 %
Eosinophils Absolute: 170 cells/uL (ref 15–500)
Eosinophils Relative: 1.5 %
HCT: 41.7 % (ref 35.0–45.0)
Hemoglobin: 13.6 g/dL (ref 11.7–15.5)
Lymphs Abs: 3571 cells/uL (ref 850–3900)
MCH: 29.6 pg (ref 27.0–33.0)
MCHC: 32.6 g/dL (ref 32.0–36.0)
MCV: 90.7 fL (ref 80.0–100.0)
MPV: 10 fL (ref 7.5–12.5)
Monocytes Relative: 8.6 %
Neutro Abs: 6509 cells/uL (ref 1500–7800)
Neutrophils Relative %: 57.6 %
Platelets: 376 10*3/uL (ref 140–400)
RBC: 4.6 10*6/uL (ref 3.80–5.10)
RDW: 13.5 % (ref 11.0–15.0)
Total Lymphocyte: 31.6 %
WBC: 11.3 10*3/uL — ABNORMAL HIGH (ref 3.8–10.8)

## 2022-05-01 LAB — CYTOLOGY - PAP
Chlamydia: NEGATIVE
Comment: NEGATIVE
Comment: NEGATIVE
Comment: NEGATIVE
Comment: NORMAL
Diagnosis: NEGATIVE
High risk HPV: NEGATIVE
Neisseria Gonorrhea: NEGATIVE
Trichomonas: NEGATIVE

## 2022-05-01 LAB — HEPATITIS C ANTIBODY
Hepatitis C Ab: NONREACTIVE
SIGNAL TO CUT-OFF: 0.12 (ref <1.00)

## 2022-05-01 LAB — URINE CULTURE
MICRO NUMBER:: 13445176
SPECIMEN QUALITY:: ADEQUATE

## 2022-05-01 LAB — RPR: RPR Ser Ql: NONREACTIVE

## 2022-05-01 LAB — HIV ANTIBODY (ROUTINE TESTING W REFLEX): HIV 1&2 Ab, 4th Generation: NONREACTIVE

## 2022-05-06 NOTE — Telephone Encounter (Signed)
Left another message in voice mail asking patient to call and let me know if any breast imaging performed anywhere previously. I asked her to call me asap so I can get her scheduled.

## 2022-05-07 ENCOUNTER — Ambulatory Visit (INDEPENDENT_AMBULATORY_CARE_PROVIDER_SITE_OTHER): Payer: 59 | Admitting: Obstetrics and Gynecology

## 2022-05-07 ENCOUNTER — Encounter: Payer: Self-pay | Admitting: Obstetrics and Gynecology

## 2022-05-07 DIAGNOSIS — R102 Pelvic and perineal pain: Secondary | ICD-10-CM

## 2022-05-07 DIAGNOSIS — N83202 Unspecified ovarian cyst, left side: Secondary | ICD-10-CM

## 2022-05-07 DIAGNOSIS — B9689 Other specified bacterial agents as the cause of diseases classified elsewhere: Secondary | ICD-10-CM | POA: Diagnosis not present

## 2022-05-07 DIAGNOSIS — N760 Acute vaginitis: Secondary | ICD-10-CM

## 2022-05-07 MED ORDER — METRONIDAZOLE 0.75 % VA GEL
1.0000 | Freq: Every day | VAGINAL | 0 refills | Status: DC
Start: 2022-05-07 — End: 2022-06-16

## 2022-05-07 NOTE — Progress Notes (Signed)
GYNECOLOGY  VISIT   HPI: 46 y.o.   Single White or Caucasian Not Hispanic or Latino  female   [phone] with No LMP recorded.   here for follow up for pelvic pain. Kristine Holder was seen last week with abdominal pelvic pain and was treated for possible PID based on exam finding.  Her WBC was 11.3. +BV on wet prep, negative STD testing.  Ultrasound showed a 3 cm simple left ovarian cyst. +perfusion to both ovaries.    Kristine Holder states that Kristine Holder is feeling better than Kristine Holder was. But the doxycycline gave her an upset stomach and so did the Flagyl. Kristine Holder stopped taking the doxycycline on Saturday and the flagyl on Sunday. Kristine Holder states that her pain is gone and Kristine Holder feeing much better.  Kristine Holder has no pain currently, still with vaginal d/c (didn't complete the Flagyl).   GYNECOLOGIC HISTORY: No LMP recorded. Contraception:depo  Menopausal hormone therapy: none         OB History     Gravida  2   Para  1   Term  1   Preterm      AB  1   Living  1      SAB      IAB  1   Ectopic      Multiple      Live Births  1              Patient Active Problem List   Diagnosis Date Noted   Adult acne 09/22/2019   Panic disorder 10/03/2018   Dependence on nicotine from cigarettes 05/17/2018   History of abnormal cervical Pap smear 05/17/2018   Irritable bowel syndrome (IBS) 05/17/2018   Encounter for Depo-Provera contraception 01/13/2018   Allergic rhinitis 04/02/2014    Past Medical History:  Diagnosis Date   Anxiety    Dependence on nicotine from cigarettes 05/17/2018   Depression    Dysmenorrhea    History of abnormal cervical Pap smear 05/17/2018   H/o LEEP 2008 with nl f/u until 2015: nl pap with + HR HPV - no follow up.   Human papilloma virus (HPV) infection    Irritable bowel syndrome (IBS) 05/17/2018   Migraine    PID (pelvic inflammatory disease)     Past Surgical History:  Procedure Laterality Date   CERVICAL BIOPSY  W/ LOOP ELECTRODE EXCISION     LEEP  2008    Current  Outpatient Medications  Medication Sig Dispense Refill   acetaminophen (TYLENOL) 325 MG tablet Take 2 tablets (650 mg total) by mouth every 6 (six) hours as needed for up to 30 doses for moderate pain or mild pain. 30 tablet 0   ALPRAZolam (XANAX) 0.25 MG tablet TAKE 1 TABLET BY MOUTH DAILY AS NEEDED FOR ANXIETY 30 tablet 2   clindamycin (CLEOCIN-T) 1 % external solution Apply topically 2 (two) times daily. 60 mL 3   doxycycline (VIBRAMYCIN) 100 MG capsule Take 1 capsule (100 mg total) by mouth 2 (two) times daily. Take BID for 14 days.  Take with food as can cause GI distress. 28 capsule 0   escitalopram (LEXAPRO) 20 MG tablet Take 1 tablet (20 mg total) by mouth daily. 90 tablet 0   medroxyPROGESTERone (DEPO-PROVERA) 150 MG/ML injection Inject 1 mL (150 mg total) into the muscle once for 1 dose. 1 mL 0   metroNIDAZOLE (FLAGYL) 500 MG tablet Take 1 tablet (500 mg total) by mouth 2 (two) times daily. 28 tablet 0   No current facility-administered  medications for this visit.     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[employer], [place]  Tobacco Use   Smoking status: Every Day    Packs/day: 1.00    Years: 24.00    Pack years: 24.00    Types: Cigarettes   Smokeless tobacco: Never   Tobacco comments:    counseled verbally during visits  Vaping Use   Vaping Use: Never used  Substance and Sexual Activity   Alcohol use: Yes    Comment: Social   Drug use: Yes    Types: Marijuana    Sexual activity: Yes    Birth control/protection: Abstinence, Injection  Other Topics Concern   Not on file  Social History Narrative   Never married, single; lives with son and dog; 2 grands   Assist [employer]   Social Determinants of Health   Financial Resource Strain: Not on file  Food Insecurity: Not on file  Transportation Needs: Not on file  Physical Activity: Not on file  Stress: Not on file  Social Connections: Not on file  Intimate Partner Violence: Not on file    Review of Systems  All other systems reviewed and are negative.  PHYSICAL EXAMINATION:    There were no vitals taken for this visit.    General appearance: alert, cooperative and appears stated age Abdomen: soft, non-tender; non distended, no masses,  no organomegaly  Pelvic: External genitalia:  no lesions              Urethra:  normal appearing urethra with no masses, tenderness or lesions              Bartholins and Skenes: normal                 Cervix: no cervical motion tenderness              Bimanual Exam:  Uterus:  normal size, contour, position, consistency, mobility, non-tender              Adnexa: no mass, fullness, tenderness                Chaperone was present for exam.  1. Pelvic pain Resolved. Kristine Holder received partial treatment for PID, std testing was all negative.  Kristine Holder will reach out with recurrent symptoms  2. BV (bacterial vaginosis) Didn't tolerate oral flagyl, Kristine Holder continues to have symptoms.  - metroNIDAZOLE (METROGEL) 0.75 % vaginal gel; Place 1 Applicatorful vaginally at bedtime. Use for 5 nights  Dispense: 70 g; Refill: 0  3. Cyst of left ovary Simple cyst, 3 cm, no f/u needed Normal exam today

## 2022-05-25 NOTE — Telephone Encounter (Signed)
Called patient and left another message to call the office and let me know if any previous breast imaging and where so that I can schedule her diagnostic breast imaging.

## 2022-06-08 NOTE — Telephone Encounter (Signed)
Dr. Oscar La, this patient has not called back.

## 2022-06-08 NOTE — Telephone Encounter (Signed)
Please send her a letter and close the chart.

## 2022-06-10 NOTE — Telephone Encounter (Signed)
Letter mailed to patient asking her to call so we can assist her with scheduling recommend diagnostic breast imaging. Copy is in Epic Chart. Per Dr. Oscar La encounter closed.

## 2022-06-16 ENCOUNTER — Encounter: Payer: Self-pay | Admitting: Obstetrics and Gynecology

## 2022-06-16 ENCOUNTER — Ambulatory Visit (INDEPENDENT_AMBULATORY_CARE_PROVIDER_SITE_OTHER): Payer: 59 | Admitting: Obstetrics and Gynecology

## 2022-06-16 VITALS — BP 110/62 | HR 72 | Wt 105.0 lb

## 2022-06-16 DIAGNOSIS — B9689 Other specified bacterial agents as the cause of diseases classified elsewhere: Secondary | ICD-10-CM

## 2022-06-16 DIAGNOSIS — N760 Acute vaginitis: Secondary | ICD-10-CM

## 2022-06-16 DIAGNOSIS — N921 Excessive and frequent menstruation with irregular cycle: Secondary | ICD-10-CM

## 2022-06-16 MED ORDER — METRONIDAZOLE 500 MG PO TABS: 500.0000 mg | ORAL_TABLET | Freq: Two times a day (BID) | ORAL | 0 refills | Status: DC

## 2022-06-16 NOTE — Progress Notes (Signed)
GYNECOLOGY  VISIT   HPI: 46 y.o.   Single White or Caucasian Not Hispanic or Latino  female   [phone] with No LMP recorded.   here for  irregular bleeding. She states that her bleeding started July 1st, light. Mostly when she wipes. Mild cramping. She restarted depo provera in 5/23, had previously stopped it in 4/22.   She never picked up the Metrogel that was prescribed last month.  because it was $65.   She is supposed to have diagnostic breast imaging. States the office has been trying to contact her about prior imaging, but she hasn't had any.   GYNECOLOGIC HISTORY: No LMP recorded. Contraception:Depo  Menopausal hormone therapy: none         OB History     Gravida  2   Para  1   Term  1   Preterm      AB  1   Living  1      SAB      IAB  1   Ectopic      Multiple      Live Births  1              Patient Active Problem List   Diagnosis Date Noted   Adult acne 09/22/2019   Panic disorder 10/03/2018   Dependence on nicotine from cigarettes 05/17/2018   History of abnormal cervical Pap smear 05/17/2018   Irritable bowel syndrome (IBS) 05/17/2018   Encounter for Depo-Provera contraception 01/13/2018   Allergic rhinitis 04/02/2014    Past Medical History:  Diagnosis Date   Anxiety    Dependence on nicotine from cigarettes 05/17/2018   Depression    Dysmenorrhea    History of abnormal cervical Pap smear 05/17/2018   H/o LEEP 2008 with nl f/u until 2015: nl pap with + HR HPV - no follow up.   Human papilloma virus (HPV) infection    Irritable bowel syndrome (IBS) 05/17/2018   Migraine    PID (pelvic inflammatory disease)     Past Surgical History:  Procedure Laterality Date   CERVICAL BIOPSY  W/ LOOP ELECTRODE EXCISION     LEEP  2008    Current Outpatient Medications  Medication Sig Dispense Refill   acetaminophen (TYLENOL) 325 MG tablet Take 2 tablets (650 mg total) by mouth every 6 (six) hours as needed for up to 30 doses for moderate  pain or mild pain. 30 tablet 0   ALPRAZolam (XANAX) 0.25 MG tablet TAKE 1 TABLET BY MOUTH DAILY AS NEEDED FOR ANXIETY 30 tablet 2   clindamycin (CLEOCIN-T) 1 % external solution Apply topically 2 (two) times daily. 60 mL 3   escitalopram (LEXAPRO) 20 MG tablet Take 1 tablet (20 mg total) by mouth daily. 90 tablet 0   medroxyPROGESTERone (DEPO-PROVERA) 150 MG/ML injection Inject 1 mL (150 mg total) into the muscle once for 1 dose. 1 mL 0   No current facility-administered medications for this visit.     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[employer], [employer]  Tobacco Use   Smoking status: Every Day    Packs/day: 1.00    Years: 24.00    Total pack years: 24.00    Types: Cigarettes   Smokeless tobacco: Never   Tobacco comments:    counseled verbally during visits  Vaping Use   Vaping Use: Never used  Substance and Sexual Activity   Alcohol use: Yes    Comment: Social   Drug use: Yes    Types: Marijuana   Sexual activity: Yes    Birth control/protection: Abstinence, Injection  Other Topics Concern   Not on file  Social History Narrative   Never married, single; lives with son and dog; 2 grands   Assist [employer]   Social Determinants of Health   Financial Resource Strain: Not on file  Food Insecurity: Not on file  Transportation Needs: Not on file  Physical Activity: Not on file  Stress: Not on file  Social Connections: Not on file  Intimate Partner  Violence: Not on file    Review of Systems  All other systems reviewed and are negative.   PHYSICAL EXAMINATION:    BP 110/62   Pulse 72   Wt 105 lb (47.6 kg)   SpO2 99%   BMI 18.60 kg/m     General appearance: alert, cooperative and appears stated age  Pelvic: External genitalia:  no lesions              Urethra:  normal appearing urethra with no masses, tenderness or lesions              Bartholins and Skenes: normal                 Vagina: normal appearing vagina with normal color and discharge, no lesions. Minimal brown d/c              Cervix: no cervical motion tenderness and no lesions              Bimanual Exam:  Uterus:  normal size, contour, position, consistency, mobility, non-tender              Adnexa: no mass, fullness, tenderness                Chaperone was present for exam.  1. Breakthrough bleeding on Depo-Provera Normal exam, patient reassure.   2. BV (bacterial vaginosis) She never took the metrogel secondary to expense. Will call in flagyl, no ETOH. - metroNIDAZOLE (FLAGYL) 500 MG tablet; Take 1 tablet (500 mg total) by mouth 2 (two) times daily.  Dispense: 14 tablet; Refill: 0  I have sent a message to triage about scheduling her breast imaging

## 2022-06-17 NOTE — Addendum Note (Signed)
Addended by: Keenan Bachelor on: 06/17/2022 01:51 PM   Modules accepted: Orders

## 2022-06-17 NOTE — Telephone Encounter (Signed)
Kristine Bolk, MD  P [facility] Triage She is supposed to have diagnostic breast imaging. States the office has been trying to contact her about prior imaging, but she hasn't had any. She said to just schedule it. I came to speak to someone, but you guys were gone.    I received this message from Dr. Oscar La.  I called patient first thing this morning and left a message that I can not schedule her diagnostic imaging until I find out if she has ever had any breast imaging performed previously and if so, where.  I asked her to call me asap so I can arrange this appointment for her.

## 2022-06-17 NOTE — Telephone Encounter (Signed)
Romualdo Bolk, MD  Keenan Bachelor, RMA She said that she has never had a mammogram. She said to go ahead and schedule the visit.

## 2022-06-17 NOTE — Telephone Encounter (Signed)
Diagnostic Bilateral Mammo and Right breast u/s scheduled at [facility] for 06/24/22 at 12:50 pm to arrive 10 minutes earlier.  My Chart message sent to patient informing her. Message left in her voice mail as well with date/time and instructions.

## 2022-06-22 ENCOUNTER — Telehealth: Payer: Self-pay

## 2022-06-22 NOTE — Telephone Encounter (Signed)
Patient had called Dr. Oscar La 06/17/22 about [place] trying to reach her to schedule diag imaging. She told Dr. Oscar La she had never had mammo  before and for [place] to go ahead and schedule.  I scheduled it and sent her a My Chart message with date/time and instructions. Today that message returned unread so I called her and left detailed message in voice mail. I did remind her about the 24 hour cancellation notice and failure could result in $75 charge.

## 2022-06-24 ENCOUNTER — Ambulatory Visit
Admission: RE | Admit: 2022-06-24 | Discharge: 2022-06-24 | Disposition: A | Discharge status: Home / Self Care | Payer: 59 | Source: Ambulatory Visit | Attending: Obstetrics and Gynecology | Admitting: Obstetrics and Gynecology

## 2022-06-24 DIAGNOSIS — N6312 Unspecified lump in the right breast, upper inner quadrant: Secondary | ICD-10-CM

## 2022-07-06 ENCOUNTER — Other Ambulatory Visit: Payer: Self-pay | Admitting: Family Medicine

## 2022-07-06 NOTE — Telephone Encounter (Signed)
Message sent to [facility]

## 2022-07-06 NOTE — Telephone Encounter (Signed)
..   Encourage patient to contact the pharmacy for refills or they can request refills through [facility]  LAST APPOINTMENT DATE:  04/28/22  NEXT APPOINTMENT DATE: None  MEDICATION: escitalopram (LEXAPRO) 20 MG tablet ALPRAZolam (XANAX) 0.25 MG tablet   Is the patient out of medication? Has 9 doses of xanax and 6 doses of lexapro.  PHARMACY: [facility] [address] [place], [place], [place] [address]  Patient would like to know if a 90 day supply could be sent in since she is losing her insurance coverage tomorrow.

## 2022-07-10 ENCOUNTER — Telehealth: Payer: Self-pay | Admitting: Family Medicine

## 2022-07-10 NOTE — Telephone Encounter (Signed)
Please notify pt that she is overdue for care with me. I last saw her in may 2022.  She needs an ov for further refills of this. Also will need to schedule her annual physical.  Thanks.

## 2022-07-13 ENCOUNTER — Other Ambulatory Visit: Payer: Self-pay | Admitting: Family

## 2022-07-13 DIAGNOSIS — Z3042 Encounter for surveillance of injectable contraceptive: Secondary | ICD-10-CM

## 2022-07-14 NOTE — Telephone Encounter (Signed)
Patient has called in to inquire on her request.  I have given her Dr. Modesta Messing response.  Patient is scheduled for 8/11 for a follow up on medication and scheduled in December for CPE.

## 2022-07-17 ENCOUNTER — Ambulatory Visit (INDEPENDENT_AMBULATORY_CARE_PROVIDER_SITE_OTHER): Payer: 59 | Admitting: Family Medicine

## 2022-07-17 ENCOUNTER — Encounter: Payer: Self-pay | Admitting: Family Medicine

## 2022-07-17 VITALS — BP 100/64 | HR 82 | Temp 98.7°F | Ht 63.0 in | Wt 104.8 lb

## 2022-07-17 DIAGNOSIS — Z716 Tobacco abuse counseling: Secondary | ICD-10-CM

## 2022-07-17 DIAGNOSIS — F410 Panic disorder [episodic paroxysmal anxiety] without agoraphobia: Secondary | ICD-10-CM | POA: Diagnosis not present

## 2022-07-17 DIAGNOSIS — F17210 Nicotine dependence, cigarettes, uncomplicated: Secondary | ICD-10-CM | POA: Diagnosis not present

## 2022-07-17 MED ORDER — ALPRAZOLAM 0.25 MG PO TABS: 0.2500 mg | ORAL_TABLET | Freq: Every day | ORAL | 2 refills | Status: DC | PRN

## 2022-07-17 MED ORDER — ESCITALOPRAM OXALATE 20 MG PO TABS: 20.0000 mg | ORAL_TABLET | Freq: Every day | ORAL | 1 refills | Status: DC

## 2022-07-17 NOTE — Patient Instructions (Signed)
Please follow up as scheduled for your next visit with me: 11/09/2022   If you have any questions or concerns, please don't hesitate to send me a message via MyChart or call the office at [phone]. Thank you for visiting with [place] today! It's our pleasure caring for you.   Steps to Quit Smoking Smoking tobacco is the leading cause of preventable death. It can affect almost every organ in the body. Smoking puts you and those around you at risk for developing many serious chronic diseases. Quitting smoking can be very challenging. Do not get discouraged if you are not successful the first time. Some people need to make many attempts to quit before they achieve long-term success. Do your best to stick to your quit plan, and talk with your health care provider if you have any questions or concerns. How do I get ready to quit? When you decide to quit smoking, create a plan to help you succeed. Before you quit: Pick a date to quit. Set a date within the next 2 weeks to give you time to prepare. Write down the reasons why you are quitting. Keep this list in places where you will see it often. Tell your family, friends, and co-workers that you are quitting. Support from people you are close to can make quitting easier. Talk with your health care provider about your options for quitting smoking. Find out what treatment options are covered by your health insurance. Identify people, places, things, and activities that make you want to smoke (triggers). Avoid them. What first steps can I take to quit smoking? Throw away all cigarettes at home, at work, and in your car. Throw away smoking accessories, such as Set designer. Clean your car. Make sure to empty the ashtray. Clean your home, including curtains and carpets. What strategies can I use to quit smoking? Talk with your health care provider about combining strategies, such as taking medicines while you are also receiving in-person counseling.  Using these two strategies together makes you more likely to succeed in quitting than if you used either strategy on its own. If you are pregnant or breastfeeding, talk with your health care provider about finding counseling or other support strategies to quit smoking. Do not take medicine to help you quit smoking unless your health care provider tells you to. Quit right away Quit smoking completely, instead of gradually reducing how much you smoke over a period of time. Stopping smoking right away may be more successful than gradually quitting. Attend in-person counseling to help you build problem-solving skills. You are more likely to succeed in quitting if you attend counseling sessions regularly. Even short sessions of 10 minutes can be effective. Take medicine You may take medicines to help you quit smoking. Some medicines require a prescription. You can also purchase over-the-counter medicines. Medicines may have nicotine in them to replace the nicotine in cigarettes. Medicines may: Help to stop cravings. Help to relieve withdrawal symptoms. Your health care provider may recommend: Nicotine patches, gum, or lozenges. Nicotine inhalers or sprays. Non-nicotine medicine that you take by mouth. Find resources Find resources and support systems that can help you quit smoking and remain smoke-free after you quit. These resources are most helpful when you use them often. They include: Online chats with a Veterinary surgeon. Telephone quitlines. Printed Materials engineer. Support groups or group counseling. Text messaging programs. Mobile phone apps or applications. Use apps that can help you stick to your quit plan by providing reminders, tips, and encouragement.  Examples of free services include Quit Guide from the CDC and smokefree.gov  What can I do to make it easier to quit?  Reach out to your family and friends for support and encouragement. Call telephone quitlines, such as 1-800-QUIT-NOW,  reach out to support groups, or work with a counselor for support. Ask people who smoke to avoid smoking around you. Avoid places that trigger you to smoke, such as bars, parties, or smoke-break areas at work. Spend time with people who do not smoke. Lessen the stress in your life. Stress can be a smoking trigger for some people. To lessen stress, try: Exercising regularly. Doing deep-breathing exercises. Doing yoga. Meditating. What benefits will I see if I quit smoking? Over time, you should start to see positive results, such as: Improved sense of smell and taste. Decreased coughing and sore throat. Slower heart rate. Lower blood pressure. Clearer and healthier skin. The ability to breathe more easily. Fewer sick days. Summary Quitting smoking can be very challenging. Do not get discouraged if you are not successful the first time. Some people need to make many attempts to quit before they achieve long-term success. When you decide to quit smoking, create a plan to help you succeed. Quit smoking right away, not slowly over a period of time. Find resources and support systems that can help you quit smoking and remain smoke-free after you quit. This information is not intended to replace advice given to you by your health care provider. Make sure you discuss any questions you have with your health care provider. Document Revised: 11/14/2021 Document Reviewed: 11/14/2021 Elsevier Patient Education  2023 [employer].

## 2022-07-17 NOTE — Progress Notes (Signed)
Subjective  CC:  Chief Complaint  Patient presents with   Anxiety    Pt here to ger refills on Xana/lexapro    HPI: Kristine Holder is a 46 y.o. female who presents to the office today to address the problems listed above in the chief complaint, mood problems. Patient with panic disorder/anxiety disorder.  Has been on Lexapro and has been well-controlled for over the last year.  She needs refills.  She did go through a hard time over the last 3 months with job changes.  She had some irregular vaginal bleeding and lost her job due to missing work.  Fortunately, things are improving.  She has found a new job and is liking it.  She had some mild depressive symptoms but those have improved also.  Her Xanax use and smoking escalated while she was going through these changes but Xanax is now used only intermittently.  I reviewed her [place] drug database and refills have been appropriate.      07/17/2022    9:29 AM 07/14/2021    8:45 AM 07/10/2020   12:11 PM  Depression screen PHQ 2/9  Decreased Interest 0 0 1  Down, Depressed, Hopeless 0 0 1  PHQ - 2 Score 0 0 2  Altered sleeping   3  Tired, decreased energy   3  Change in appetite   0  Feeling bad or failure about yourself    1  Trouble concentrating   1  Moving slowly or fidgety/restless   0  Suicidal thoughts   0  PHQ-9 Score   10  Difficult doing work/chores   Somewhat difficult   Smoking cessation: She has been precontemplative about quitting.  She has failed multiple times in the past but has been successful in the past.  She would like to try to quit again.  She does feel some cough and shortness of breath has complications from her smoking.  Assessment  1. Panic disorder   2. Encounter for tobacco use cessation counseling      Plan  Panic/anxiety: Stabilizing.  Continue Lexapro and as needed Xanax.  Counseling done.  Refilled Smoking cessation counseling done.  Recommend nicotine patch and problem solving with her  smoking habits.  See after visit summary Reviewed concept of mood problems caused by biochemical imbalance of neurotransmitters and rationale for treatment with medications and therapy.  Counseling given: pt was instructed to contact office, on-call physician or crisis Hotline if symptoms worsen significantly. If patient develops any suicidal or homicidal thoughts, she is directed to the ER immediately.    Follow up: Return for as scheduled.  Cpe in december No orders of the defined types were placed in this encounter.  Meds ordered this encounter  Medications   ALPRAZolam (XANAX) 0.25 MG tablet    Sig: Take 1 tablet (0.25 mg total) by mouth daily as needed. for anxiety    Dispense:  30 tablet    Refill:  2    Not to exceed 5 additional fills before 06/17/2022   escitalopram (LEXAPRO) 20 MG tablet    Sig: Take 1 tablet (20 mg total) by mouth daily.    Dispense:  90 tablet    Refill:  1      I reviewed the patients updated PMH, FH, and SocHx.    Patient Active Problem List   Diagnosis Date Noted   Adult acne 09/22/2019   Panic disorder 10/03/2018   Dependence on nicotine from cigarettes 05/17/2018  History of abnormal cervical Pap smear 05/17/2018   Irritable bowel syndrome (IBS) 05/17/2018   Encounter for Depo-Provera contraception 01/13/2018   Allergic rhinitis 04/02/2014   Current Meds  Medication Sig   acetaminophen (TYLENOL) 325 MG tablet Take 2 tablets (650 mg total) by mouth every 6 (six) hours as needed for up to 30 doses for moderate pain or mild pain.   clindamycin (CLEOCIN-T) 1 % external solution Apply topically 2 (two) times daily.   medroxyPROGESTERone (DEPO-PROVERA) 150 MG/ML injection INJECT 1 ML INTO THE MUSCLE ONCE FOR 1 DOSE.   [DISCONTINUED] ALPRAZolam (XANAX) 0.25 MG tablet TAKE 1 TABLET BY MOUTH DAILY AS NEEDED FOR ANXIETY   [DISCONTINUED] escitalopram (LEXAPRO) 20 MG tablet Take 1 tablet (20 mg total) by mouth daily.    Allergies: Patient is allergic  to ciprofloxacin hcl and prednisone. Family history:  Patient family history includes Alcohol abuse in her maternal grandfather and paternal grandfather; COPD in her paternal grandmother; Healthy in her son; Hypertension in her father; Kidney disease in her mother; Learning disabilities in her mother. Social History   Socioeconomic History   Marital status: Single    Spouse name: Not on file   Number of children: 1   Years of education: Not on file   Highest education level: Not on file  Occupational History   Occupation: [employer], [place]  Tobacco Use   Smoking status: Every Day    Packs/day: 1.00    Years: 24.00    Total pack years: 24.00    Types: Cigarettes   Smokeless tobacco: Never   Tobacco comments:    counseled verbally during visits  Vaping Use   Vaping Use: Never used  Substance and Sexual Activity   Alcohol use: Yes    Comment: Social   Drug use: Yes    Types: Marijuana   Sexual activity: Yes    Birth control/protection: Abstinence, Injection  Other Topics Concern   Not on file  Social History Narrative   Never married, single; lives with son and dog; 2 grands   [employer]   Social Determinants of Health   Financial Resource Strain: Not on file  Food Insecurity: Not on file  Transportation Needs: Not on file  Physical Activity: Not on file  Stress: Not on file  Social Connections: Not on file     Review of Systems: Constitutional: Negative for fever malaise or anorexia Cardiovascular: negative for chest pain Respiratory: negative for SOB or persistent cough Gastrointestinal: negative for abdominal pain  Objective  Vitals: BP 100/64   Pulse 82   Temp 98.7 F (37.1 C)   Ht 5\' 3"  (1.6 m)   Wt 104 lb 12.8 oz (47.5 kg)   SpO2 98%   BMI 18.56 kg/m  General: no acute distress, well appearing, no apparent distress, well groomed Psych:  Alert and oriented x 3,normal mood, behavior, speech, dress, and thought  processes.    Commons side effects, risks, benefits, and alternatives for medications and treatment plan prescribed today were discussed, and the patient expressed understanding of the given instructions. Patient is instructed to call or message via MyChart if he/she has any questions or concerns regarding our treatment plan. No barriers to understanding were identified. We discussed Red Flag symptoms and signs in detail. Patient expressed understanding regarding what to do in case of urgent or emergency type symptoms.  Medication list was reconciled, printed and provided to the patient in AVS. Patient instructions and summary information  was reviewed with the patient as documented in the AVS. This note was prepared with assistance of Dragon voice recognition software. Occasional wrong-word or sound-a-like substitutions may have occurred due to the inherent limitations of voice recognition software

## 2022-07-21 ENCOUNTER — Ambulatory Visit (INDEPENDENT_AMBULATORY_CARE_PROVIDER_SITE_OTHER): Payer: 59

## 2022-07-21 VITALS — BP 138/76

## 2022-07-21 DIAGNOSIS — Z3042 Encounter for surveillance of injectable contraceptive: Secondary | ICD-10-CM

## 2022-07-21 MED ORDER — MEDROXYPROGESTERONE ACETATE 150 MG/ML IM SUSP
150.0000 mg | Freq: Once | INTRAMUSCULAR | Status: AC
  Administered 2022-07-21: 150 mg via INTRAMUSCULAR

## 2022-07-21 NOTE — Progress Notes (Signed)
Date last pap: 04-30-22. Last Depo-Provera: 04-23-22. Side Effects if any: n/a. Serum HCG indicated? .no Depo-Provera 150 mg IM left upper outer quadrant. Next appointment for depo provera 10-06-22 to 10/20/22.   Patient supplied Depo Provera which was Rx by PCP. She thought she had to bring medication with her for injection. She doesn't want PCP following her Depo. Advised patient we have Depo Provera here and if she wants Dr.Jertson to follow, she will not have PCP prescribe. She prefers to get Depo Provera in our office.  Routed to provider.

## 2022-08-31 ENCOUNTER — Encounter: Payer: Self-pay | Admitting: *Deleted

## 2022-10-03 ENCOUNTER — Other Ambulatory Visit: Payer: Self-pay | Admitting: Family Medicine

## 2022-10-03 DIAGNOSIS — Z3042 Encounter for surveillance of injectable contraceptive: Secondary | ICD-10-CM

## 2022-10-07 ENCOUNTER — Ambulatory Visit: Payer: 59

## 2022-10-08 ENCOUNTER — Ambulatory Visit (INDEPENDENT_AMBULATORY_CARE_PROVIDER_SITE_OTHER): Payer: 59

## 2022-10-08 DIAGNOSIS — Z3042 Encounter for surveillance of injectable contraceptive: Secondary | ICD-10-CM | POA: Diagnosis not present

## 2022-10-08 MED ORDER — MEDROXYPROGESTERONE ACETATE 150 MG/ML IM SUSY: PREFILLED_SYRINGE | Freq: Once | INTRAMUSCULAR | Status: AC

## 2022-10-08 NOTE — Progress Notes (Signed)
Pt received Medroxyprogesterone 150 mg/ml in the left upper quad, pt tolerated well.

## 2022-10-19 ENCOUNTER — Ambulatory Visit (INDEPENDENT_AMBULATORY_CARE_PROVIDER_SITE_OTHER): Payer: BC Managed Care – PPO | Admitting: Family Medicine

## 2022-10-19 ENCOUNTER — Telehealth: Payer: Self-pay | Admitting: Family Medicine

## 2022-10-19 VITALS — BP 106/64 | HR 82 | Temp 99.3°F | Wt 113.6 lb

## 2022-10-19 DIAGNOSIS — H0012 Chalazion right lower eyelid: Secondary | ICD-10-CM

## 2022-10-19 DIAGNOSIS — H00014 Hordeolum externum left upper eyelid: Secondary | ICD-10-CM

## 2022-10-19 DIAGNOSIS — H01004 Unspecified blepharitis left upper eyelid: Secondary | ICD-10-CM

## 2022-10-19 MED ORDER — AMOXICILLIN 500 MG PO TABS: 500.0000 mg | ORAL_TABLET | Freq: Two times a day (BID) | ORAL | 0 refills | Status: AC

## 2022-10-19 NOTE — Progress Notes (Signed)
Subjective:    Patient ID: Kristine Holder, female    DOB: 01/05/1976, 46 y.o.   MRN: [id number]  Chief Complaint  Patient presents with   Cyst    On L eye, started last week, then it started getting bigger. Was doing warm compresses, but when got bigger, she popped it on Saturday, and has now become uncomfortable. Was given cream for the cyst she gets on er body, but not sure if could use it on the eyelid.     HPI Patient was seen today for acute concern.  Pt developed a pimple like bump on L upper eyelid.  Pt tried compresses and squeezing the area.  Eyelid now uncomfortable and swollen.  Patient also notes a pimple-like pop inferior to right eye, not painful, present times a while.  Past Medical History:  Diagnosis Date   Anxiety    Dependence on nicotine from cigarettes 05/17/2018   Depression    Dysmenorrhea    History of abnormal cervical Pap smear 05/17/2018   H/o LEEP 2008 with nl f/u until 2015: nl pap with + HR HPV - no follow up.   Human papilloma virus (HPV) infection    Irritable bowel syndrome (IBS) 05/17/2018   Migraine    PID (pelvic inflammatory disease)     Allergies  Allergen Reactions   Ciprofloxacin Hcl Nausea And Vomiting    Other reaction(s): Other (See Comments)   Prednisone Other (See Comments)    Makes patient feel loopy Other reaction(s): Other (See Comments) Makes patient feel loopy Makes patient feel loopy    ROS General: Denies fever, chills, night sweats, changes in weight, changes in appetite HEENT: Denies headaches, ear pain, changes in vision, rhinorrhea, sore throat  + left eyelid edema and bump CV: Denies CP, palpitations, SOB, orthopnea Pulm: Denies SOB, cough, wheezing GI: Denies abdominal pain, nausea, vomiting, diarrhea, constipation GU: Denies dysuria, hematuria, frequency, vaginal discharge Msk: Denies muscle cramps, joint pains Neuro: Denies weakness, numbness, tingling Skin: Denies rashes, bruising  + bump inferior to right  eye Psych: Denies depression, anxiety, hallucinations      Objective:    Blood pressure 106/64, pulse 82, temperature 99.3 F (37.4 C), temperature source Oral, weight 113 lb 9.6 oz (51.5 kg), SpO2 98 %.  Gen. Pleasant, well-nourished, in no distress, normal affect   HEENT: [place]/AT, face symmetric, conjunctiva clear, no scleral icterus, left eyelid edema and bump upper eyelid.  PERRLA, EOMI, nares patent without drainage. Lungs: no accessory muscle use, CTAB, no wheezes or rales Cardiovascular: RRR, no m/r/g, no peripheral edema Musculoskeletal: No deformities, no cyanosis or clubbing, normal tone Neuro:  A&Ox3, CN II-XII intact, normal gait Skin:  Warm, dry, intact.  Left upper eyelid edema and pustule medially.  Right upper cheek inferior to right lower eyelid with a small firm papule.   Wt Readings from Last 3 Encounters:  07/17/22 104 lb 12.8 oz (47.5 kg)  06/16/22 105 lb (47.6 kg)  04/30/22 104 lb (47.2 kg)    Lab Results  Component Value Date   WBC 11.3 (H) 04/30/2022   HGB 13.6 04/30/2022   HCT 41.7 04/30/2022   PLT 376 04/30/2022   GLUCOSE 94 06/11/2021   CHOL 175 05/31/2018   TRIG 43.0 05/31/2018   HDL 39.30 05/31/2018   LDLCALC 127 (H) 05/31/2018   ALT 8 06/11/2021   AST 12 (L) 06/11/2021   NA 140 06/11/2021   K 4.4 06/11/2021   CL 107 06/11/2021   CREATININE 0.80 06/11/2021  BUN 7 06/11/2021   CO2 26 06/11/2021   TSH 1.05 04/09/2021    Assessment/Plan:  Hordeolum externum of left upper eyelid - Plan: amoxicillin (AMOXIL) 500 MG tablet  Chalazion of right lower eyelid  Blepharitis of left upper eyelid, unspecified type - Plan: amoxicillin (AMOXIL) 500 MG tablet   Amoxicillin started for blepharitis and hordeolum.  Continue to apply warm compresses.  For continued or worsening symptoms refer to ophthalmology for drainage.  Patient advised to keep eyelids clean and dry, avoid wearing eye make-up.  Can use a no tears baby shampoo to gently cleanse eyelids  with a Q-tip.  Given strict precautions.  F/u as needed  Abbe Amsterdam, MD

## 2022-10-19 NOTE — Telephone Encounter (Signed)
Pt scheduled 10/20/22 appt through MyChart.   Pt states: -From appointment notes submitted through MyChart "I have something going on with my eye. I get these bumps on my face & it it's in my eye. It's uncomfortable & making it difficult to see." -Because she doesn't have proof of her new insurance, she was reluctant to go to urgent care since PCP's practice is full for today 10/19/22. -She would like to have some feedback if she should go to Urgent Care or continue with home care.   Pt referred to PCP triage nurse, warm transferred to [facility], patient coordinator with team health.  Awaiting follow up notes.

## 2022-10-19 NOTE — Telephone Encounter (Signed)
Routing to 10/19/22 [facility] team  and PCP team.  Patient Name: Kristine Holder Gender: Female DOB: 11/15/1976 Age: 46 Y 25 D Return Phone Number: [phone] (Primary) Address: City/ State/ Zip: [place]  [address] Client [facility] [place] [facility] - [facility], [facility] at [place] [facility] Provider Asencion Partridge- MD Contact Type Call Who Is Calling Patient / Member / Family / Caregiver Call Type Triage / Clinical Relationship To Patient Self Return Phone Number [phone] (Primary) Chief Complaint Skin Lesion - Moles/ Lumps/ Growths Reason for Call Symptomatic / Request for Health Information Initial Comment Caller states she is having a bump in eyelid going toward to nose. She popped it and applying hot compress and it is still painful. Translation No Nurse Assessment Nurse: Wyn Quaker, RN, [name] Date/Time (Eastern Time): 10/19/2022 9:35:08 AM Confirm and document reason for call. If symptomatic, describe symptoms. ---Caller stated that she has had a bump to her eyelid near her nose. Sat she popped the area because it came to a head. She had been applying hot compresses. She first noted it last week. Does the patient have any new or worsening symptoms? ---Yes Will a triage be completed? ---Yes Related visit to physician within the last 2 weeks? ---No Does the PT have any chronic conditions? (i.e. diabetes, asthma, this includes High risk factors for pregnancy, etc.) ---No Is the patient pregnant or possibly pregnant? (Ask all females between the ages of 43-55) ---No Is this a behavioral health or substance abuse call? ---No Guidelines Guideline Title Affirmed Question Affirmed Notes Nurse Date/Time (Eastern Time) Sty Redness spreads around the eye (both upper and lower eyelid are red) Dew, RN, [name] 10/19/2022 9:37:35 AM  Disp. Time [name] Time) Disposition Final User 10/19/2022 9:39:14 AM See HCP within 4 Hours  (or PCP triage) Yes Wyn Quaker, RN, [name] Final Disposition 10/19/2022 9:39:14 AM See HCP within 4 Hours (or PCP triage) Yes Dew, RN, [name] Disagree/Comply Comply Caller Understands Yes PreDisposition Call Doctor Care Advice Given Per Guideline SEE HCP (OR PCP TRIAGE) WITHIN 4 HOURS: * IF OFFICE WILL BE OPEN: You need to be seen within the next 3 or 4 hours. Call your doctor (or NP/PA) now or as soon as the office opens. CALL BACK IF: * You become worse CARE ADVICE given per Sty (Adult) guideline. Comments User: Gifford Shave, RN Date/Time [name] Time): 10/19/2022 9:43:06 AM I called backline of the office and Raynelle Fanning is talking to pt about an app't at another office location. Referrals REFERRED TO PCP OFFICE

## 2022-10-20 ENCOUNTER — Ambulatory Visit: Payer: Self-pay | Admitting: Physician Assistant

## 2022-10-21 ENCOUNTER — Other Ambulatory Visit (HOSPITAL_COMMUNITY)
Admission: RE | Admit: 2022-10-21 | Discharge: 2022-10-21 | Disposition: A | Discharge status: Home / Self Care | Payer: BC Managed Care – PPO | Source: Ambulatory Visit | Attending: Physician Assistant | Admitting: Physician Assistant

## 2022-10-21 ENCOUNTER — Encounter: Payer: Self-pay | Admitting: Physician Assistant

## 2022-10-21 ENCOUNTER — Ambulatory Visit (INDEPENDENT_AMBULATORY_CARE_PROVIDER_SITE_OTHER): Payer: BC Managed Care – PPO | Admitting: Physician Assistant

## 2022-10-21 VITALS — BP 100/60 | HR 80 | Temp 97.8°F | Ht 63.0 in | Wt 112.2 lb

## 2022-10-21 DIAGNOSIS — N761 Subacute and chronic vaginitis: Secondary | ICD-10-CM | POA: Diagnosis present

## 2022-10-21 DIAGNOSIS — H1032 Unspecified acute conjunctivitis, left eye: Secondary | ICD-10-CM | POA: Diagnosis not present

## 2022-10-21 MED ORDER — POLYMYXIN B-TRIMETHOPRIM 10000-0.1 UNIT/ML-% OP SOLN: 1.0000 [drp] | OPHTHALMIC | 0 refills | Status: DC

## 2022-10-21 NOTE — Progress Notes (Signed)
Kristine Holder is a 46 y.o. female here for a new problem.  History of Present Illness:   Chief Complaint  Patient presents with   STD testing   Vaginal Discharge    Pt c/o white vaginal discharge off & on x 1 month, slight itching, no odor.    HPI  Vaginal Discharge Patient is complaining of a month of intermittent white vaginal discharge with a slight itch and no odor. She reports that she recently had a new sexual partner and used no protection. Patient states that her partner has other partners as well. She has a hx of BV. Patient does not manage symptoms with any OTC products.  She denies unusual vaginal bleeding, vaginal pain, fever, chills, and pelvic pain.  Left Eye Discharge Patient reports that she was seen on 11/13 by Dr. Salomon Fick for hordeolum externum of left upper eyelid. She was prescribed 500 mg Amoxicillin BID. She states that her eyes was matted shut with discharge this morning.  Denies fever, chills, sore throat  Past Medical History:  Diagnosis Date   Anxiety    Dependence on nicotine from cigarettes 05/17/2018   Depression    Dysmenorrhea    History of abnormal cervical Pap smear 05/17/2018   H/o LEEP 2008 with nl f/u until 2015: nl pap with + HR HPV - no follow up.   Human papilloma virus (HPV) infection    Irritable bowel syndrome (IBS) 05/17/2018   Migraine    PID (pelvic inflammatory disease)      Social History   Tobacco Use   Smoking status: Every Day    Packs/day: 1.00    Years: 24.00    Total pack years: 24.00    Types: Cigarettes   Smokeless tobacco: Never   Tobacco comments:    counseled verbally during visits  Vaping Use   Vaping Use: Never used  Substance Use Topics   Alcohol use: Yes    Comment: Social   Drug use: Yes    Types: Marijuana    Past Surgical History:  Procedure Laterality Date   CERVICAL BIOPSY  W/ LOOP ELECTRODE EXCISION     LEEP  2008    Family History  Problem Relation Age of Onset   Hypertension Father     Kidney disease Mother    Learning disabilities Mother    Healthy Son    Alcohol abuse Maternal Grandfather    COPD Paternal Grandmother    Alcohol abuse Paternal Grandfather     Allergies  Allergen Reactions   Ciprofloxacin Hcl Nausea And Vomiting    Other reaction(s): Other (See Comments)   Prednisone Other (See Comments)    Makes patient feel loopy Other reaction(s): Other (See Comments) Makes patient feel loopy Makes patient feel loopy    Current Medications:   Current Outpatient Medications:    acetaminophen (TYLENOL) 325 MG tablet, Take 2 tablets (650 mg total) by mouth every 6 (six) hours as needed for up to 30 doses for moderate pain or mild pain., Disp: 30 tablet, Rfl: 0   ALPRAZolam (XANAX) 0.25 MG tablet, Take 1 tablet (0.25 mg total) by mouth daily as needed. for anxiety, Disp: 30 tablet, Rfl: 2   amoxicillin (AMOXIL) 500 MG tablet, Take 1 tablet (500 mg total) by mouth 2 (two) times daily for 7 days., Disp: 14 tablet, Rfl: 0   clindamycin (CLEOCIN-T) 1 % external solution, Apply topically 2 (two) times daily. (Patient taking differently: Apply topically as needed.), Disp: 60 mL, Rfl: 3  escitalopram (LEXAPRO) 20 MG tablet, Take 1 tablet (20 mg total) by mouth daily., Disp: 90 tablet, Rfl: 1   medroxyPROGESTERone (DEPO-PROVERA) 150 MG/ML injection, INJECT 1 ML INTO THE MUSCLE ONCE FOR 1 DOSE, Disp: 1 mL, Rfl: 0   Review of Systems:   Review of Systems  HENT:         (+) hordeolum externum of left upper eyelid  Musculoskeletal:        (+) white vaginal discharge    Vitals:   Vitals:   10/21/22 1127  BP: 100/60  Pulse: 80  Temp: 97.8 F (36.6 C)  TempSrc: Temporal  SpO2: 98%  Weight: 112 lb 4 oz (50.9 kg)  Height: 5\' 3"  (1.6 m)     Body mass index is 19.88 kg/m.  Physical Exam:   Physical Exam Exam conducted with a chaperone present.  Constitutional:      Appearance: Normal appearance. She is well-developed.  HENT:     Head: Normocephalic and  atraumatic.  Eyes:     General: Lids are normal.     Extraocular Movements: Extraocular movements intact.     Conjunctiva/sclera:     Left eye: Left conjunctiva is injected.  Pulmonary:     Effort: Pulmonary effort is normal.  Genitourinary:    Vagina: No vaginal discharge.     Cervix: No cervical motion tenderness.  Musculoskeletal:        General: Normal range of motion.     Cervical back: Normal range of motion and neck supple.  Skin:    General: Skin is warm and dry.  Neurological:     Mental Status: She is alert and oriented to person, place, and time.  Psychiatric:        Attention and Perception: Attention and perception normal.        Mood and Affect: Mood normal.        Behavior: Behavior normal.        Thought Content: Thought content normal.        Judgment: Judgment normal.     Assessment and Plan:   Chronic vaginitis No red flags on exam Vaginal swab and STD testing completed today We will treat based off results Avoid sexual activity until results return  Acute conjunctivitis of left eye, unspecified acute conjunctivitis type No red flags on exam We will add Polytrim eyedrop for her If worsening symptoms, will need ophthalmology evaluation  I,Verona Buck,acting as a scribe for , PA.,have documented all relevant documentation on the behalf of Jarold Motto, PA,as directed by  Jarold Motto, PA while in the presence of Jarold Motto, Jarold Motto.  I, [place], Jarold Motto, have reviewed all documentation for this visit. The documentation on 10/21/22 for the exam, diagnosis, procedures, and orders are all accurate and complete.  10/23/22, PA-C

## 2022-10-21 NOTE — Patient Instructions (Signed)
It was great to see you!  I will be in touch with all results. Avoid sexual activity until results are in.  Please start eyedrops.  Take care,  Jarold Motto PA-C

## 2022-10-22 ENCOUNTER — Encounter: Payer: Self-pay | Admitting: Physician Assistant

## 2022-10-22 ENCOUNTER — Encounter: Payer: Self-pay | Admitting: Family Medicine

## 2022-10-22 LAB — CERVICOVAGINAL ANCILLARY ONLY
Bacterial Vaginitis (gardnerella): POSITIVE — AB
Candida Glabrata: NEGATIVE
Candida Vaginitis: POSITIVE — AB
Chlamydia: NEGATIVE
Comment: NEGATIVE
Comment: NEGATIVE
Comment: NEGATIVE
Comment: NEGATIVE
Comment: NEGATIVE
Comment: NORMAL
Neisseria Gonorrhea: NEGATIVE
Trichomonas: NEGATIVE

## 2022-10-22 LAB — RPR: RPR Ser Ql: NONREACTIVE

## 2022-10-22 LAB — HIV ANTIBODY (ROUTINE TESTING W REFLEX): HIV 1&2 Ab, 4th Generation: NONREACTIVE

## 2022-10-22 NOTE — Telephone Encounter (Signed)
Please see message and advise if work note okay?

## 2022-10-23 ENCOUNTER — Other Ambulatory Visit: Payer: Self-pay | Admitting: Physician Assistant

## 2022-10-23 ENCOUNTER — Encounter: Payer: Self-pay | Admitting: Family Medicine

## 2022-10-23 MED ORDER — FLUCONAZOLE 150 MG PO TABS: 150.0000 mg | ORAL_TABLET | Freq: Once | ORAL | 0 refills | Status: DC

## 2022-10-23 MED ORDER — METRONIDAZOLE 500 MG PO TABS: 500.0000 mg | ORAL_TABLET | Freq: Two times a day (BID) | ORAL | 0 refills | Status: AC

## 2022-10-26 ENCOUNTER — Encounter: Payer: Self-pay | Admitting: Family

## 2022-10-26 ENCOUNTER — Encounter: Payer: Self-pay | Admitting: Physician Assistant

## 2022-11-09 ENCOUNTER — Encounter: Payer: 59 | Admitting: Family Medicine

## 2022-12-29 ENCOUNTER — Ambulatory Visit: Payer: BC Managed Care – PPO | Admitting: Physician Assistant

## 2022-12-29 ENCOUNTER — Encounter: Payer: Self-pay | Admitting: Physician Assistant

## 2022-12-29 VITALS — BP 128/83 | HR 96 | Temp 97.5°F | Ht 63.0 in | Wt 111.4 lb

## 2022-12-29 DIAGNOSIS — G4483 Primary cough headache: Secondary | ICD-10-CM

## 2022-12-29 DIAGNOSIS — J029 Acute pharyngitis, unspecified: Secondary | ICD-10-CM | POA: Diagnosis not present

## 2022-12-29 LAB — POCT INFLUENZA A/B
Influenza A, POC: NEGATIVE
Influenza B, POC: NEGATIVE

## 2022-12-29 LAB — POC COVID19 BINAXNOW: SARS Coronavirus 2 Ag: NEGATIVE

## 2022-12-29 LAB — POCT RAPID STREP A (OFFICE): Rapid Strep A Screen: NEGATIVE

## 2022-12-29 MED ORDER — IBUPROFEN 600 MG PO TABS: 600.0000 mg | ORAL_TABLET | Freq: Three times a day (TID) | ORAL | 0 refills | Status: DC | PRN

## 2022-12-29 MED ORDER — AZITHROMYCIN 250 MG PO TABS: ORAL_TABLET | ORAL | 0 refills | Status: AC

## 2022-12-29 NOTE — Progress Notes (Signed)
Kristine Holder is a 47 y.o. female here for a new problem.  History of Present Illness:   Chief Complaint  Patient presents with   Cough    Pt states she has had cough, sinus pressure, sore throat, achy all over, and headache since Sunday. Had fever Sunday and running low grade since, no fever at check in today. Not tested for Covid;     Cough This is a new problem. The current episode started in the past 7 days. The problem has been gradually worsening. The problem occurs every few minutes. The cough is Productive of purulent sputum. Associated symptoms include chills, ear pain, a fever, headaches, myalgias, nasal congestion, postnasal drip, rhinorrhea, a sore throat and sweats. Pertinent negatives include no chest pain, ear congestion, heartburn, hemoptysis, rash, shortness of breath, weight loss or wheezing. She has tried OTC cough suppressant, rest and body position changes for the symptoms. The treatment provided mild relief. Her past medical history is significant for emphysema.      Past Medical History:  Diagnosis Date   Anxiety    Dependence on nicotine from cigarettes 05/17/2018   Depression    Dysmenorrhea    History of abnormal cervical Pap smear 05/17/2018   H/o LEEP 2008 with nl f/u until 2015: nl pap with + HR HPV - no follow up.   Human papilloma virus (HPV) infection    Irritable bowel syndrome (IBS) 05/17/2018   Migraine    PID (pelvic inflammatory disease)      Social History   Tobacco Use   Smoking status: Every Day    Packs/day: 1.00    Years: 24.00    Total pack years: 24.00    Types: Cigarettes   Smokeless tobacco: Never   Tobacco comments:    counseled verbally during visits  Vaping Use   Vaping Use: Never used  Substance Use Topics   Alcohol use: Yes    Comment: Social   Drug use: Yes    Types: Marijuana    Past Surgical History:  Procedure Laterality Date   CERVICAL BIOPSY  W/ LOOP ELECTRODE EXCISION     LEEP  2008    Family History   Problem Relation Age of Onset   Hypertension Father    Kidney disease Mother    Learning disabilities Mother    Healthy Son    Alcohol abuse Maternal Grandfather    COPD Paternal Grandmother    Alcohol abuse Paternal Grandfather     Allergies  Allergen Reactions   Ciprofloxacin Hcl Nausea And Vomiting    Other reaction(s): Other (See Comments)   Prednisone Other (See Comments)    Makes patient feel loopy Other reaction(s): Other (See Comments) Makes patient feel loopy Makes patient feel loopy    Current Medications:   Current Outpatient Medications:    acetaminophen (TYLENOL) 325 MG tablet, Take 2 tablets (650 mg total) by mouth every 6 (six) hours as needed for up to 30 doses for moderate pain or mild pain., Disp: 30 tablet, Rfl: 0   ALPRAZolam (XANAX) 0.25 MG tablet, Take 1 tablet (0.25 mg total) by mouth daily as needed. for anxiety, Disp: 30 tablet, Rfl: 2   clindamycin (CLEOCIN-T) 1 % external solution, Apply topically 2 (two) times daily. (Patient taking differently: Apply topically as needed.), Disp: 60 mL, Rfl: 3   escitalopram (LEXAPRO) 20 MG tablet, Take 1 tablet (20 mg total) by mouth daily., Disp: 90 tablet, Rfl: 1   fluconazole (DIFLUCAN) 100 MG tablet, Take 1.5  tablets (150 mg total) by mouth daily., Disp: 2 tablet, Rfl: 0   medroxyPROGESTERone (DEPO-PROVERA) 150 MG/ML injection, INJECT 1 ML INTO THE MUSCLE ONCE FOR 1 DOSE, Disp: 1 mL, Rfl: 0   trimethoprim-polymyxin b (POLYTRIM) ophthalmic solution, Place 1 drop into the left eye every 4 (four) hours., Disp: 10 mL, Rfl: 0   Review of Systems:   Review of Systems  Constitutional:  Positive for chills and fever. Negative for weight loss.  HENT:  Positive for ear pain, postnasal drip, rhinorrhea and sore throat.   Respiratory:  Positive for cough. Negative for hemoptysis, shortness of breath and wheezing.   Cardiovascular:  Negative for chest pain.  Gastrointestinal:  Negative for heartburn.  Musculoskeletal:   Positive for myalgias.  Skin:  Negative for rash.  Neurological:  Positive for headaches.    Vitals:   Vitals:   12/29/22 1006  BP: 128/83  Pulse: 96  Temp: (!) 97.5 F (36.4 C)  TempSrc: Temporal  SpO2: 98%  Weight: 111 lb 6.4 oz (50.5 kg)  Height: 5\' 3"  (1.6 m)     Body mass index is 19.73 kg/m.  Physical Exam:   Physical Exam Vitals and nursing note reviewed.  Constitutional:      General: She is not in acute distress.    Appearance: She is well-developed. She is not ill-appearing or toxic-appearing.  HENT:     Head: Normocephalic and atraumatic.     Right Ear: Ear canal and external ear normal. A middle ear effusion is present. Tympanic membrane is not erythematous, retracted or bulging.     Left Ear: Ear canal and external ear normal. A middle ear effusion is present. Tympanic membrane is not erythematous, retracted or bulging.     Nose: Nose normal.     Right Sinus: No maxillary sinus tenderness or frontal sinus tenderness.     Left Sinus: No maxillary sinus tenderness or frontal sinus tenderness.     Mouth/Throat:     Pharynx: Uvula midline. Posterior oropharyngeal erythema present.     Tonsils: No tonsillar exudate.  Eyes:     General: Lids are normal.     Conjunctiva/sclera: Conjunctivae normal.  Neck:     Trachea: Trachea normal.  Cardiovascular:     Rate and Rhythm: Normal rate and regular rhythm.     Heart sounds: Normal heart sounds, S1 normal and S2 normal.  Pulmonary:     Effort: Pulmonary effort is normal.     Breath sounds: Normal breath sounds. No decreased breath sounds, wheezing, rhonchi or rales.  Lymphadenopathy:     Cervical: No cervical adenopathy.  Skin:    General: Skin is warm and dry.  Neurological:     Mental Status: She is alert.  Psychiatric:        Speech: Speech normal.        Behavior: Behavior normal. Behavior is cooperative.    Results for orders placed or performed in visit on 12/29/22  POC COVID-19 [facility]  Result  Value Ref Range   SARS Coronavirus 2 Ag Negative Negative  POCT Influenza A/B  Result Value Ref Range   Influenza A, POC Negative Negative   Influenza B, POC Negative Negative  POCT rapid strep A  Result Value Ref Range   Rapid Strep A Screen Negative Negative     Assessment and Plan:   Cough headache; Sore throat No red flags on discussion, patient is not in any obvious distress during our visit. Discussed progression of most viral illness, and  recommended supportive care at this point in time. I did however provide pocket rx for oral azithromycin given should symptoms not improve as anticipated. She cannot tolerate oral prednisone, will give rx for ibuprofen 600 mg TID prn for body aches.  Discussed over the counter supportive care options, with recommendations to push fluids and rest. Reviewed return precautions including new/worsening fever, SOB, new/worsening cough or other concerns.  Recommended need to self-quarantine and practice social distancing until symptoms resolve.  I recommend that patient follow-up if symptoms worsen or persist despite treatment x 7-10 days, sooner if needed.  Work note provided.  Inda Coke, PA-C

## 2022-12-29 NOTE — Patient Instructions (Signed)
It was great to see you!  Start oral ibuprofen and azithromycin Take ibuprofen for body aches -- once these improve, please discontinue  Push fluids and get plenty of rest. Please return if you are not improving as expected, or if you have high fevers (>101.5) or difficulty swallowing or worsening productive cough.  Call clinic with questions.  I hope you start feeling better soon!

## 2022-12-30 ENCOUNTER — Encounter: Payer: Self-pay | Admitting: Physician Assistant

## 2022-12-30 ENCOUNTER — Encounter: Payer: Self-pay | Admitting: Family Medicine

## 2022-12-30 NOTE — Telephone Encounter (Signed)
Spoke to pt told her I did receive her message and she can stay out of work till Monday. I will do letter now and you can access through your My Chart. Pt verbalized understanding.

## 2023-01-03 ENCOUNTER — Other Ambulatory Visit: Payer: Self-pay | Admitting: Family Medicine

## 2023-01-03 DIAGNOSIS — Z3042 Encounter for surveillance of injectable contraceptive: Secondary | ICD-10-CM

## 2023-01-06 ENCOUNTER — Ambulatory Visit (INDEPENDENT_AMBULATORY_CARE_PROVIDER_SITE_OTHER): Payer: BC Managed Care – PPO | Admitting: Family Medicine

## 2023-01-06 ENCOUNTER — Encounter: Payer: Self-pay | Admitting: Family Medicine

## 2023-01-06 ENCOUNTER — Ambulatory Visit: Payer: BC Managed Care – PPO | Admitting: Family Medicine

## 2023-01-06 ENCOUNTER — Ambulatory Visit: Payer: BC Managed Care – PPO | Admitting: Physician Assistant

## 2023-01-06 VITALS — BP 109/72 | HR 70 | Temp 97.3°F | Ht 63.0 in | Wt 111.2 lb

## 2023-01-06 DIAGNOSIS — J029 Acute pharyngitis, unspecified: Secondary | ICD-10-CM | POA: Diagnosis not present

## 2023-01-06 LAB — POCT RAPID STREP A (OFFICE): Rapid Strep A Screen: NEGATIVE

## 2023-01-06 LAB — POC COVID19 BINAXNOW: SARS Coronavirus 2 Ag: NEGATIVE

## 2023-01-06 MED ORDER — AMOXICILLIN-POT CLAVULANATE 875-125 MG PO TABS: 1.0000 | ORAL_TABLET | Freq: Two times a day (BID) | ORAL | 0 refills | Status: DC

## 2023-01-06 MED ORDER — METHYLPREDNISOLONE 4 MG PO TBPK: ORAL_TABLET | ORAL | 0 refills | Status: DC

## 2023-01-06 NOTE — Progress Notes (Signed)
   Kristine Holder is a 47 y.o. female who presents today for an office visit.  Assessment/Plan:  Sinusitis Strep and COVID test are negative.  Given length of symptoms will start Augmentin.  She does have a listed history of allergy to prednisone however tolerated Medrol dosepak well about a year ago.  We will start this today as well.  Encouraged hydration.  She can continue using over-the-counter meds.  Discussed reasons to return to care.     Subjective:  HPI:  Patient here with cough and sore throat. This started over a week ago. She was seen here a week ago by a different provider.  Had COVID, strep, and flu test which were negative.  Diagnosed with viral URI.  Given prescription for azithromycin.  She took this.  Helped with symptoms modestly however she still has persistent cough.  Has a lot of sinus congestion.  Body aches.  No fevers.  Some chills.  No chest pain or shortness of breath.  She is coughing up thick greenish mucus      Objective:  Physical Exam: BP 109/72   Pulse 70   Temp (!) 97.3 F (36.3 C) (Temporal)   Ht 5\' 3"  (1.6 m)   Wt 111 lb 3.2 oz (50.4 kg)   SpO2 98%   BMI 19.70 kg/m   Gen: No acute distress, resting comfortably HEENT: TMs clear effusion.  OP clear.  Nose mucosa erythematous and boggy bilaterally. CV: Regular rate and rhythm with no murmurs appreciated Pulm: Normal work of breathing, clear to auscultation bilaterally with no crackles, wheezes, or rhonchi Neuro: Grossly normal, moves all extremities Psych: Normal affect and thought content      Kalanie Fewell M. Jerline Pain, MD 01/06/2023 11:10 AM

## 2023-01-06 NOTE — Patient Instructions (Signed)
It was very nice to see you today!  You have a sinus infection.  Please start the Augmentin and Medrol Dosepak.  Let us know if not improving.  Take care, Dr Jerline Pain  PLEASE NOTE:  If you had any lab tests, please let us know if you have not heard back within a few days. You may see your results on mychart before we have a chance to review them but we will give you a call once they are reviewed by [place].   If we ordered any referrals today, please let us know if you have not heard from their office within the next week.   If you had any urgent prescriptions sent in today, please check with the pharmacy within an hour of our visit to make sure the prescription was transmitted appropriately.   Please try these tips to maintain a healthy lifestyle:  Eat at least 3 REAL meals and 1-2 snacks per day.  Aim for no more than 5 hours between eating.  If you eat breakfast, please do so within one hour of getting up.   Each meal should contain half fruits/vegetables, one quarter protein, and one quarter carbs (no bigger than a computer mouse)  Cut down on sweet beverages. This includes juice, soda, and sweet tea.   Drink at least 1 glass of water with each meal and aim for at least 8 glasses per day  Exercise at least 150 minutes every week.

## 2023-01-13 ENCOUNTER — Ambulatory Visit (INDEPENDENT_AMBULATORY_CARE_PROVIDER_SITE_OTHER): Payer: BC Managed Care – PPO

## 2023-01-13 DIAGNOSIS — Z3042 Encounter for surveillance of injectable contraceptive: Secondary | ICD-10-CM | POA: Diagnosis not present

## 2023-01-13 DIAGNOSIS — Z309 Encounter for contraceptive management, unspecified: Secondary | ICD-10-CM

## 2023-01-13 MED ORDER — MEDROXYPROGESTERONE ACETATE 150 MG/ML IM SUSY: PREFILLED_SYRINGE | Freq: Once | INTRAMUSCULAR | Status: AC

## 2023-01-13 NOTE — Progress Notes (Signed)
Pt received Medroxyprogesterone 150 mg/ml in right upper quad, pt tolerated well.

## 2023-02-19 ENCOUNTER — Encounter: Payer: Self-pay | Admitting: Physician Assistant

## 2023-02-19 ENCOUNTER — Ambulatory Visit: Payer: Self-pay | Admitting: Physician Assistant

## 2023-02-19 VITALS — BP 100/60 | HR 74 | Temp 98.0°F | Ht 63.0 in | Wt 108.0 lb

## 2023-02-19 DIAGNOSIS — H1032 Unspecified acute conjunctivitis, left eye: Secondary | ICD-10-CM

## 2023-02-19 MED ORDER — POLYMYXIN B-TRIMETHOPRIM 10000-0.1 UNIT/ML-% OP SOLN: 1.0000 [drp] | OPHTHALMIC | 0 refills | Status: DC

## 2023-02-19 NOTE — Progress Notes (Signed)
Kristine Holder is a 47 y.o. female here for a new problem.  History of Present Illness:   Chief Complaint  Patient presents with   Eye Problem    Pt c/o left eye red, itching and crusted over this morning.    HPI  Left eye redness Patient reports that she woke up this morning with eye redness and crusting Eye was itchy Her symptoms have improved with time Has slight cough, attributes this to smoking Denies fevers, chills, significant nasal drainage, body aches   Past Medical History:  Diagnosis Date   Anxiety    Dependence on nicotine from cigarettes 05/17/2018   Depression    Dysmenorrhea    History of abnormal cervical Pap smear 05/17/2018   H/o LEEP 2008 with nl f/u until 2015: nl pap with + HR HPV - no follow up.   Human papilloma virus (HPV) infection    Irritable bowel syndrome (IBS) 05/17/2018   Migraine    PID (pelvic inflammatory disease)      Social History   Tobacco Use   Smoking status: Every Day    Packs/day: 1.00    Years: 24.00    Additional pack years: 0.00    Total pack years: 24.00    Types: Cigarettes   Smokeless tobacco: Never   Tobacco comments:    counseled verbally during visits  Vaping Use   Vaping Use: Never used  Substance Use Topics   Alcohol use: Yes    Comment: Social   Drug use: Yes    Types: Marijuana    Past Surgical History:  Procedure Laterality Date   CERVICAL BIOPSY  W/ LOOP ELECTRODE EXCISION     LEEP  2008    Family History  Problem Relation Age of Onset   Hypertension Father    Kidney disease Mother    Learning disabilities Mother    Healthy Son    Alcohol abuse Maternal Grandfather    COPD Paternal Grandmother    Alcohol abuse Paternal Grandfather     Allergies  Allergen Reactions   Ciprofloxacin Hcl Nausea And Vomiting    Other reaction(s): Other (See Comments)   Prednisone Other (See Comments)    Makes patient feel loopy Other reaction(s): Other (See Comments) Makes patient feel loopy Makes  patient feel loopy    Current Medications:   Current Outpatient Medications:    acetaminophen (TYLENOL) 325 MG tablet, Take 2 tablets (650 mg total) by mouth every 6 (six) hours as needed for up to 30 doses for moderate pain or mild pain., Disp: 30 tablet, Rfl: 0   ALPRAZolam (XANAX) 0.25 MG tablet, Take 1 tablet (0.25 mg total) by mouth daily as needed. for anxiety, Disp: 30 tablet, Rfl: 2   clindamycin (CLEOCIN-T) 1 % external solution, Apply topically 2 (two) times daily. (Patient taking differently: Apply topically as needed.), Disp: 60 mL, Rfl: 3   escitalopram (LEXAPRO) 20 MG tablet, Take 1 tablet (20 mg total) by mouth daily., Disp: 90 tablet, Rfl: 1   ibuprofen (ADVIL) 600 MG tablet, Take 1 tablet (600 mg total) by mouth every 8 (eight) hours as needed., Disp: 30 tablet, Rfl: 0   medroxyPROGESTERone (DEPO-PROVERA) 150 MG/ML injection, INJECT 1 ML INTO THE MUSCLE ONCE FOR 1 DOSE, Disp: 1 mL, Rfl: 0   Review of Systems:   ROS Negative unless otherwise specified per HPI.  Vitals:   Vitals:   02/19/23 1125  BP: 100/60  Pulse: 74  Temp: 98 F (36.7 C)  TempSrc: Temporal  SpO2: 99%  Weight: 108 lb (49 kg)  Height: 5\' 3"  (1.6 m)     Body mass index is 19.13 kg/m.  Physical Exam:   Physical Exam Vitals and nursing note reviewed.  Constitutional:      General: She is not in acute distress.    Appearance: She is well-developed. She is not ill-appearing or toxic-appearing.  HENT:     Head: Normocephalic and atraumatic.     Right Ear: Tympanic membrane, ear canal and external ear normal. Tympanic membrane is not erythematous, retracted or bulging.     Left Ear: Tympanic membrane, ear canal and external ear normal. Tympanic membrane is not erythematous, retracted or bulging.     Nose: Nose normal.     Right Sinus: No maxillary sinus tenderness or frontal sinus tenderness.     Left Sinus: No maxillary sinus tenderness or frontal sinus tenderness.     Mouth/Throat:      Pharynx: Uvula midline. No posterior oropharyngeal erythema.  Eyes:     General: Lids are normal.     Conjunctiva/sclera: Conjunctivae normal.     Comments: Very slight b/l injected conjunctiva  Neck:     Trachea: Trachea normal.  Cardiovascular:     Rate and Rhythm: Normal rate and regular rhythm.     Heart sounds: Normal heart sounds, S1 normal and S2 normal.  Pulmonary:     Effort: Pulmonary effort is normal.     Breath sounds: Normal breath sounds. No decreased breath sounds, wheezing, rhonchi or rales.  Lymphadenopathy:     Cervical: No cervical adenopathy.  Skin:    General: Skin is warm and dry.  Neurological:     Mental Status: She is alert.  Psychiatric:        Speech: Speech normal.        Behavior: Behavior normal. Behavior is cooperative.     Assessment and Plan:   Acute conjunctivitis of left eye, unspecified acute conjunctivitis type Likely viral No red flags and exam is almost normal Recommend supportive care I did however provide pocket rx for oral polytrim drops should symptoms not improve as anticipated. Discussed over the counter supportive care options, with recommendations to push fluids and rest. Reviewed return precautions including new/worsening fever, SOB, new/worsening cough or other concerns.  Recommended need to self-quarantine and practice social distancing until symptoms resolve. I recommend that patient follow-up if symptoms worsen or persist despite treatment x 7-10 days, sooner if needed.   Inda Coke, PA-C

## 2023-02-19 NOTE — Patient Instructions (Signed)
It was great to see you!  Next depo shot is due -- 4/25-5/9  Start eye drop, keep me posted on symptoms!  Take care,  Inda Coke PA-C

## 2023-03-17 ENCOUNTER — Encounter: Payer: Self-pay | Admitting: Family Medicine

## 2023-03-23 ENCOUNTER — Other Ambulatory Visit: Payer: Self-pay | Admitting: Family Medicine

## 2023-03-23 DIAGNOSIS — Z3042 Encounter for surveillance of injectable contraceptive: Secondary | ICD-10-CM

## 2023-03-25 ENCOUNTER — Ambulatory Visit: Payer: Self-pay

## 2023-03-30 ENCOUNTER — Ambulatory Visit: Payer: Self-pay | Admitting: Physician Assistant

## 2023-04-02 ENCOUNTER — Ambulatory Visit (INDEPENDENT_AMBULATORY_CARE_PROVIDER_SITE_OTHER): Payer: Self-pay

## 2023-04-02 DIAGNOSIS — Z3042 Encounter for surveillance of injectable contraceptive: Secondary | ICD-10-CM

## 2023-04-02 MED ORDER — MEDROXYPROGESTERONE ACETATE 150 MG/ML IM SUSP
150.0000 mg | Freq: Once | INTRAMUSCULAR | Status: AC
  Administered 2023-04-02: 150 mg via INTRAMUSCULAR

## 2023-04-06 NOTE — Progress Notes (Signed)
Pt is here for DEPO PROVERA injection Dr.[name]. Pt tolerated well.

## 2023-05-20 ENCOUNTER — Other Ambulatory Visit: Payer: Self-pay

## 2023-05-20 ENCOUNTER — Telehealth: Payer: Self-pay | Admitting: Family Medicine

## 2023-05-20 MED ORDER — ESCITALOPRAM OXALATE 20 MG PO TABS: 20.0000 mg | ORAL_TABLET | Freq: Every day | ORAL | 1 refills | Status: AC

## 2023-05-20 NOTE — Telephone Encounter (Signed)
LOV: 02/19/2023  Last Refill date: 07/17/2022  Qty: 60  Refills: 2

## 2023-05-20 NOTE — Telephone Encounter (Signed)
Prescription Request  05/20/2023  LOV: 07/17/2022  What is the name of the medication or equipment?  escitalopram (LEXAPRO) 20 MG tablet   ALPRAZolam (XANAX) 0.25 MG tablet    Have you contacted your pharmacy to request a refill? Yes   Which pharmacy would you like this sent to? [id number] - [place], [place] - [address] [place] [place] [address] Phone: [phone] Fax: [phone]   Patient notified that their request is being sent to the clinical staff for review and that they should receive a response within 2 business days.   Please advise at Mobile [phone] (mobile)

## 2023-05-21 MED ORDER — ALPRAZOLAM 0.25 MG PO TABS: 0.2500 mg | ORAL_TABLET | Freq: Every day | ORAL | 2 refills | Status: AC | PRN

## 2023-05-21 NOTE — Addendum Note (Signed)
Addended by: Asencion Partridge on: 05/21/2023 12:34 PM   Modules accepted: Orders

## 2023-10-28 ENCOUNTER — Telehealth (INDEPENDENT_AMBULATORY_CARE_PROVIDER_SITE_OTHER): Payer: Self-pay | Admitting: Physician Assistant

## 2023-10-28 ENCOUNTER — Encounter: Payer: Self-pay | Admitting: Physician Assistant

## 2023-10-28 DIAGNOSIS — R051 Acute cough: Secondary | ICD-10-CM

## 2023-10-28 MED ORDER — AZITHROMYCIN 250 MG PO TABS: ORAL_TABLET | ORAL | 0 refills | Status: AC

## 2023-10-28 NOTE — Progress Notes (Signed)
Virtual Visit via Video Note   I, Jarold Motto, PA, connected with  Kristine Holder  ([id number], [birth year]) on 10/28/23 at 11:20 AM EST by a video-enabled telemedicine application and verified that I am speaking with the correct person using two identifiers.  Location: Patient: Home Provider: [place] office   I discussed the limitations of evaluation and management by telemedicine and the availability of in person appointments. The patient expressed understanding and agreed to proceed.    History of Present Illness: Kristine Holder is a 47 y.o. who identifies as a female who was assigned female at birth, and is being seen today for cough.  She reports cough, congestion, headache(s) and flu-like symptom(s) x 2 days. Symptom(s) worsening with time. She currently does not have insurance. Does not have COVID test. She is a smoker.  Denies chest pain, shortness of breath.   Problems:  Patient Active Problem List   Diagnosis Date Noted   Adult acne 09/22/2019   Panic disorder 10/03/2018   Dependence on nicotine from cigarettes 05/17/2018   History of abnormal cervical Pap smear 05/17/2018   Irritable bowel syndrome (IBS) 05/17/2018   Encounter for Depo-Provera contraception 01/13/2018   Allergic rhinitis 04/02/2014    Allergies:  Allergies  Allergen Reactions   Ciprofloxacin Hcl Nausea And Vomiting    Other reaction(s): Other (See Comments)   Prednisone Other (See Comments)    Makes patient feel loopy Other reaction(s): Other (See Comments) Makes patient feel loopy Makes patient feel loopy   Medications:  Current Outpatient Medications:    azithromycin (ZITHROMAX) 250 MG tablet, Take 2 tablets on day 1, then 1 tablet daily on days 2 through 5, Disp: 6 tablet, Rfl: 0   acetaminophen (TYLENOL) 325 MG tablet, Take 2 tablets (650 mg total) by mouth every 6 (six) hours as needed for up to 30 doses for moderate pain or mild pain., Disp: 30 tablet, Rfl: 0    ALPRAZolam (XANAX) 0.25 MG tablet, Take 1 tablet (0.25 mg total) by mouth daily as needed. for anxiety, Disp: 30 tablet, Rfl: 2   clindamycin (CLEOCIN-T) 1 % external solution, Apply topically 2 (two) times daily. (Patient taking differently: Apply topically as needed.), Disp: 60 mL, Rfl: 3   escitalopram (LEXAPRO) 20 MG tablet, Take 1 tablet (20 mg total) by mouth daily., Disp: 90 tablet, Rfl: 1   ibuprofen (ADVIL) 600 MG tablet, Take 1 tablet (600 mg total) by mouth every 8 (eight) hours as needed., Disp: 30 tablet, Rfl: 0   medroxyPROGESTERone (DEPO-PROVERA) 150 MG/ML injection, INJECT 1 ML INTO THE MUSCLE ONCE FOR 1 DOSE, Disp: 1 mL, Rfl: 0   trimethoprim-polymyxin b (POLYTRIM) ophthalmic solution, Place 1 drop into the right eye every 4 (four) hours., Disp: 10 mL, Rfl: 0  Observations/Objective: Patient is well-developed, well-nourished in no acute distress.  Resting comfortably  at home.  Head is normocephalic, atraumatic.  No labored breathing.  Speech is clear and coherent with logical content.  Patient is alert and oriented at baseline.   Assessment and Plan: Acute cough No red flags on discussion, patient is not in any obvious distress during our visit. Discussed progression of most viral illness, and recommended supportive care at this point in time. I did however provide pocket rx for oral azithromycin should symptoms not improve as anticipated. Discussed over the counter supportive care options, with recommendations to push fluids and rest. Reviewed return precautions including new/worsening fever, SOB, new/worsening cough or other concerns.  Recommended need  to self-quarantine and practice social distancing until symptoms resolve. Discussed current recommendations for COVID testing. I recommend that patient follow-up if symptoms worsen or persist despite treatment x 7-10 days, sooner if needed.  Work note provided.    Follow Up Instructions: I discussed the assessment and  treatment plan with the patient. The patient was provided an opportunity to ask questions and all were answered. The patient agreed with the plan and demonstrated an understanding of the instructions.  A copy of instructions were sent to the patient via MyChart unless otherwise noted below.   The patient was advised to call back or seek an in-person evaluation if the symptoms worsen or if the condition fails to improve as anticipated.  Jarold Motto, [place]

## 2023-12-15 ENCOUNTER — Encounter: Payer: Self-pay | Admitting: Family Medicine

## 2023-12-15 ENCOUNTER — Telehealth: Payer: Self-pay | Admitting: Family Medicine

## 2023-12-15 VITALS — Ht 63.0 in

## 2023-12-15 DIAGNOSIS — B349 Viral infection, unspecified: Secondary | ICD-10-CM

## 2023-12-15 DIAGNOSIS — F17210 Nicotine dependence, cigarettes, uncomplicated: Secondary | ICD-10-CM

## 2023-12-15 MED ORDER — GUAIFENESIN-CODEINE 100-10 MG/5ML PO SOLN: 5.0000 mL | Freq: Four times a day (QID) | ORAL | 0 refills | Status: DC | PRN

## 2023-12-15 NOTE — Patient Instructions (Signed)
 Please schedule an appointment for your annual complete physical; please come fasting.   If you have any questions or concerns, please don't hesitate to send me a message via MyChart or call the office at [phone]. Thank you for visiting with us  today! It's our pleasure caring for you.   VISIT SUMMARY:  You presented with sudden body aches, fever, fatigue, cough, and sore throat. Your fever has resolved, but you continue to experience fatigue and a persistent cough. Your COVID-19 test was negative, and your symptoms are likely due to a viral upper respiratory infection. You have been advised to rest and take medications to manage your symptoms.  YOUR PLAN:  -VIRAL UPPER RESPIRATORY INFECTION or Flu like illness: A viral upper respiratory infection is a common illness caused by a virus that affects the nose, throat, and airways. You are advised to rest, stay hydrated, and take over-the-counter medications like Delsym or Mucinex  DM for your daytime cough, and Robitussin with codeine  for nighttime cough. Ibuprofen  can help with body aches and sore throat. Monitor your symptoms and return if they worsen.   INSTRUCTIONS:  You are provided with a note for work absence until Friday. Please return to work when you are feeling better and are fever-free.

## 2023-12-15 NOTE — Progress Notes (Signed)
 Subjective  CC:  Chief Complaint  Patient presents with   Cough    Started Sunday with body aches, fever of 101 on Monday. Has been taking Advil . Fever has broken but she still does not feel good.   Virtual Visit via Video Note I connected with Kristine Holder on 12/15/23 at  2:30 PM EST by a video enabled telemedicine application and verified that I am speaking with the correct person using two identifiers. Location patient: Home Location provider: [facility] Persons participating in the virtual visit: Kristine Holder Lavern LITTIE Jodie, MD Avelina Berber, CMA  I discussed the limitations of evaluation and management by telemedicine and the availability of in person appointments. The patient expressed understanding and agreed to proceed.  HPI: Kristine Holder is a 48 y.o. female who presents to the office today to address the problems listed above in the chief complaint. Discussed the use of AI scribe software for clinical note transcription with the patient, who gave verbal consent to proceed.  History of Present Illness   The patient, a smoker, presented with a sudden onset of generalized body aches severe enough to necessitate leaving work early. Accompanying these symptoms was a fever peaking at 101 degrees Fahrenheit. The patient managed the fever with Advil  and over-the-counter cold and flu medications, and it has since resolved. However, the patient continues to feel unwell, describing persistent fatigue and a lingering malaise.  In addition to the body aches and fever, the patient developed a cough and sore throat. The cough is non-productive and is associated with chest tightness, particularly during coughing episodes. The patient denies any other symptoms such as shortness of breath, nausea, vomiting, diarrhea, or abdominal pain.  The patient's workplace environment may be a potential source of exposure as her boss was recently ill. The patient  has taken a COVID-19 test, the result of which was negative. Despite the negative COVID-19 test, the patient continues to experience significant fatigue, a persistent cough, and a sore throat. The patient's sleep has been disrupted due to the cough.  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 The patient has not received a flu shot this year.       Assessment  1. Acute viral syndrome   2. Cigarette nicotine dependence without complication      Plan  Assessment and Plan    Viral Upper Respiratory Infection/flu like illness Acute onset of body aches, fever (peaked at 101F), sore throat, and non-productive cough. Symptoms began a few days ago. Fever has resolved, but fatigue and cough persist. Differential diagnosis includes influenza and COVID-19, but COVID-19 test is negative. Likely viral etiology given symptomatology and lack of severe symptoms such as shortness of breath or high fever. Discussed rest for fatigue, over-the-counter medications for cough, and ibuprofen  for body aches and sore throat. Emphasized fluids and rest. Symptoms should improve with time; monitor for worsening conditions. - Prescribe Robitussin with codeine  for nighttime cough - Recommend Delsym or Mucinex  DM for daytime cough - Advise taking ibuprofen  for body aches and sore throat - Encourage fluids and rest - Monitor symptoms and return if condition worsens  General Health Maintenance Did not receive a flu shot this year. Discussed the importance of annual influenza vaccination. - Recommend getting a flu shot in the future  Follow-up - Provide a note for work absence until Friday - Advise to  return to work when feeling better and fever-free.        No orders of the defined types were placed in this encounter.  Meds ordered this encounter  Medications   guaiFENesin -codeine  100-10 MG/5ML syrup    Sig: Take 5 mLs by mouth every 6  (six) hours as needed for cough.    Dispense:  120 mL    Refill:  0     I reviewed the patients updated PMH, FH, and SocHx.    Patient Active Problem List   Diagnosis Date Noted   Adult acne 09/22/2019   Panic disorder 10/03/2018   Dependence on nicotine from cigarettes 05/17/2018   History of abnormal cervical Pap smear 05/17/2018   Irritable bowel syndrome (IBS) 05/17/2018   Encounter for Depo-Provera  contraception 01/13/2018   Allergic rhinitis 04/02/2014   Current Meds  Medication Sig   acetaminophen  (TYLENOL ) 325 MG tablet Take 2 tablets (650 mg total) by mouth every 6 (six) hours as needed for up to 30 doses for moderate pain or mild pain.   ALPRAZolam  (XANAX ) 0.25 MG tablet Take 1 tablet (0.25 mg total) by mouth daily as needed. for anxiety   escitalopram  (LEXAPRO ) 20 MG tablet Take 1 tablet (20 mg total) by mouth daily.   guaiFENesin -codeine  100-10 MG/5ML syrup Take 5 mLs by mouth every 6 (six) hours as needed for cough.   ibuprofen  (ADVIL ) 600 MG tablet Take 1 tablet (600 mg total) by mouth every 8 (eight) hours as needed.    Allergies: Patient is allergic to ciprofloxacin hcl and prednisone. Family History: Patient family history includes Alcohol abuse in her maternal grandfather and paternal grandfather; COPD in her paternal grandmother; Healthy in her son; Hypertension in her father; Kidney disease in her mother; Learning disabilities in her mother. Social History:  Patient  reports that she has been smoking cigarettes. She has a 24 pack-year smoking history. She has never used smokeless tobacco. She reports current alcohol use. She reports current drug use. Drug: Marijuana.  Review of Systems: Constitutional: Negative for fever malaise or anorexia Cardiovascular: negative for chest pain Respiratory: negative for SOB or persistent cough Gastrointestinal: negative for abdominal pain  Objective  Vitals: Ht 5' 3 (1.6 m)   BMI 19.13 kg/m  General: no acute distress  , A&Ox3, non toxic, appears tired   Commons side effects, risks, benefits, and alternatives for medications and treatment plan prescribed today were discussed, and the patient expressed understanding of the given instructions. Patient is instructed to call or message via MyChart if he/she has any questions or concerns regarding our treatment plan. No barriers to understanding were identified. We discussed Red Flag symptoms and signs in detail. Patient expressed understanding regarding what to do in case of urgent or emergency type symptoms.  Medication list was reconciled, printed and provided to the patient in AVS. Patient instructions and summary information was reviewed with the patient as documented in the AVS. This note was prepared with assistance of Dragon voice recognition software. Occasional wrong-word or sound-a-like substitutions may have occurred due to the inherent limitations of voice recognition software

## 2024-11-13 ENCOUNTER — Encounter: Payer: Self-pay | Admitting: Family Medicine

## 2024-11-13 ENCOUNTER — Other Ambulatory Visit (HOSPITAL_COMMUNITY)
Admission: RE | Admit: 2024-11-13 | Discharge: 2024-11-13 | Disposition: A | Payer: Self-pay | Source: Ambulatory Visit | Attending: Family Medicine | Admitting: Family Medicine

## 2024-11-13 ENCOUNTER — Ambulatory Visit: Payer: Self-pay | Admitting: Family Medicine

## 2024-11-13 VITALS — BP 126/70 | HR 62 | Temp 98.8°F | Ht 63.0 in | Wt 105.8 lb

## 2024-11-13 DIAGNOSIS — R350 Frequency of micturition: Secondary | ICD-10-CM

## 2024-11-13 DIAGNOSIS — N898 Other specified noninflammatory disorders of vagina: Secondary | ICD-10-CM

## 2024-11-13 DIAGNOSIS — R10A1 Flank pain, right side: Secondary | ICD-10-CM

## 2024-11-13 LAB — POCT URINALYSIS DIPSTICK
Bilirubin, UA: NEGATIVE
Blood, UA: NEGATIVE
Glucose, UA: NEGATIVE
Ketones, UA: NEGATIVE
Nitrite, UA: NEGATIVE
Protein, UA: NEGATIVE
Spec Grav, UA: 1.015 (ref 1.010–1.025)
Urobilinogen, UA: 0.2 U/dL
pH, UA: 6 (ref 5.0–8.0)

## 2024-11-13 LAB — URINALYSIS, MICROSCOPIC ONLY

## 2024-11-13 NOTE — Patient Instructions (Signed)
 Please return in for complete physical  I will release your lab results to you on your MyChart account with further instructions. You may see the results before I do, but when I review them I will send you a message with my report or have my assistant call you if things need to be discussed. Please reply to my message with any questions. Thank you!   If you have any questions or concerns, please don't hesitate to send me a message via MyChart or call the office at [phone]. Thank you for visiting with us  today! It's our pleasure caring for you.    VISIT SUMMARY: Today, you were seen for side pain and vaginal symptoms. You have been experiencing side pain for about a week, which has been getting worse, along with nausea and unusual sensations in your lower abdomen. Additionally, you have had heavier white vaginal discharge, itching, and occasional burning during urination. You have not had a fever, chills, or blood in your urine.  YOUR PLAN: -VAGINAL DISCHARGE: You have been experiencing white vaginal discharge with mild itching and occasional burning. This could be due to a yeast infection or a bladder infection. We have taken swabs for testing, including tests for gonorrhea and chlamydia. If it is a yeast infection, you can use over-the-counter treatments. We will wait for the results of the urine culture and swabs before prescribing any antibiotics.  -FREQUENCY OF MICTURITION: You have had an intermittent burning sensation during urination, which could be a sign of a bladder infection. We will wait for the results of the urine culture before prescribing any antibiotics.  INSTRUCTIONS: Please await the results of the urine culture and swabs. If you experience worsening symptoms or develop a fever, please contact our office immediately.                      Contains text generated by [employer]bridge.                                 Contains text  generated by Abridge.

## 2024-11-13 NOTE — Progress Notes (Signed)
 Subjective  CC:  Chief Complaint  Patient presents with   Urinary Frequency    Along with burning, side pain, and a heavy discharge. Would like to do a STD full panel screening.     HPI: Kristine Holder is a 48 y.o. female who presents to the office today to address the problems listed above in the chief complaint. Discussed the use of AI scribe software for clinical note transcription with the patient, who gave verbal consent to proceed.  History of Present Illness Kristine Holder is a 48 year old female who presents with side pain and vaginal symptoms. LMP > 2 years ago. Postmenoausal.   Patient reports she had right sided flank pain that was mild.  That was related to her job and back pain.  But persisted.  Then started having mild increase urination with mild burning on the last couple days.  She also admits to a vaginal discharge that is white with mild itching.  No fevers chills or pelvic pain.  No gross hematuria.  She does request STD testing.  She feels a little bit sore in the perineum but no localized rashes or lesions.  No known risk for STDs.  She has 1 monogamous partner.  Denies abdominal pain.  Had mild nausea few days ago but that has resolved.  Her back pain has resolved.  Vaginal discharge and genitourinary symptoms - Heavier, white vaginal discharge for several days - Associated vaginal itching - Occasional dysuria - Sensation of swelling when sitting down - No known exposure to sexually transmitted diseases - Same sexual partner - Not currently pregnant  Menstrual and contraceptive history - Menstrual periods ceased for over one year - Cessation of menses occurred after discontinuing Depo-Provera  one to two years ago   Assessment  1. Vaginal discharge   2. Frequent urination   3. Right flank pain      Plan  Assessment and Plan Assessment & Plan Vaginal discharge White discharge with mild itching and occasional burning sensation. No fever or  chills. Differential includes yeast infection and possible bladder infection. Pelvic exam showed no significant discharge, but mild soreness and swelling possibly due to yeast infection or hemorrhoids.  Exam is benign. - Sent swabs for testing including gonorrhea and chlamydia, yeast, BV and trichomonas. - Advised over-the-counter yeast infection treatment if yeast infection is confirmed due to self-pay status. - Rule out urinary tract infection.  Await results of urine culture and swabs before prescribing antibiotics  Frequency of micturition Intermittent burning sensation during urination. No blood in urine. Differential includes bladder infection. - Await results of urine culture before prescribing antibiotics  Back pain likely musculoskeletal.    Follow up: For complete physical Orders Placed This Encounter  Procedures   Urine Culture   Urine Microscopic Only   POCT Urinalysis Dipstick   No orders of the defined types were placed in this encounter.    I reviewed the patients updated PMH, FH, and SocHx.  Patient Active Problem List   Diagnosis Date Noted   Adult acne 09/22/2019   Panic disorder 10/03/2018   Dependence on nicotine from cigarettes 05/17/2018   History of abnormal cervical Pap smear 05/17/2018   Irritable bowel syndrome (IBS) 05/17/2018   Allergic rhinitis 04/02/2014   Current Meds  Medication Sig   ALPRAZolam  (XANAX ) 0.25 MG tablet Take 1 tablet (0.25 mg total) by mouth daily as needed. for anxiety   escitalopram  (LEXAPRO ) 20 MG tablet Take 1 tablet (20 mg total) by  mouth daily.   Allergies: Patient is allergic to ciprofloxacin hcl and prednisone. Family History: Patient family history includes Alcohol abuse in her maternal grandfather and paternal grandfather; COPD in her paternal grandmother; Healthy in her son; Hypertension in her father; Kidney disease in her mother; Learning disabilities in her mother. Social History:  Patient  reports that she has  been smoking cigarettes. She has a 24 pack-year smoking history. She has never used smokeless tobacco. She reports current alcohol use. She reports current drug use. Drug: Marijuana.  Review of Systems: Constitutional: Negative for fever malaise or anorexia Cardiovascular: negative for chest pain Respiratory: negative for SOB or persistent cough Gastrointestinal: negative for abdominal pain  Objective  Vitals: BP 126/70   Pulse 62   Temp 98.8 F (37.1 C)   Ht 5' 3 (1.6 m)   Wt 105 lb 12.8 oz (48 kg)   SpO2 99%   BMI 18.74 kg/m  General: no acute distress , A&Ox3, appears well HEENT: PEERL, conjunctiva normal, neck is supple Cardiovascular:  RRR without murmur or gallop.,  No CVA tenderness Respiratory:  Good breath sounds bilaterally, CTAB with normal respiratory effort Benign abdomen Pelvic: Normal external genitalia.  Hemorrhoids present.  Clear cervix, minimal white discharge.  No CMT, nontender fundus no adnexal masses Skin:  Warm, no rashes Commons side effects, risks, benefits, and alternatives for medications and treatment plan prescribed today were discussed, and the patient expressed understanding of the given instructions. Patient is instructed to call or message via MyChart if he/she has any questions or concerns regarding our treatment plan. No barriers to understanding were identified. We discussed Red Flag symptoms and signs in detail. Patient expressed understanding regarding what to do in case of urgent or emergency type symptoms.  Medication list was reconciled, printed and provided to the patient in AVS. Patient instructions and summary information was reviewed with the patient as documented in the AVS. This note was prepared with assistance of Dragon voice recognition software. Occasional wrong-word or sound-a-like substitutions may have occurred due to the inherent limitations of voice recognition software

## 2024-11-14 ENCOUNTER — Encounter: Payer: Self-pay | Admitting: Family Medicine

## 2024-11-14 ENCOUNTER — Ambulatory Visit: Payer: Self-pay | Admitting: Family Medicine

## 2024-11-14 LAB — CERVICOVAGINAL ANCILLARY ONLY
Bacterial Vaginitis (gardnerella): POSITIVE — AB
Candida Glabrata: NEGATIVE
Candida Vaginitis: NEGATIVE
Chlamydia: NEGATIVE
Comment: NEGATIVE
Comment: NEGATIVE
Comment: NEGATIVE
Comment: NEGATIVE
Comment: NEGATIVE
Comment: NORMAL
Neisseria Gonorrhea: NEGATIVE
Trichomonas: POSITIVE — AB

## 2024-11-14 MED ORDER — METRONIDAZOLE 500 MG PO TABS: 500.0000 mg | ORAL_TABLET | Freq: Two times a day (BID) | ORAL | 0 refills | Status: AC

## 2024-11-14 NOTE — Telephone Encounter (Signed)
 Please review and advise. Tks

## 2024-11-14 NOTE — Telephone Encounter (Signed)
 Spoke with pt regarding lab results and recommendatins. Pt understood

## 2024-11-14 NOTE — Progress Notes (Signed)
 Please call patient:Urine and swab testing show she has an STD called trichomonas. I recommend taking antibiotics to clear the infection.  I will wait on the results from the urine culture and treat if they also show a bladder infection.  The Chlamydia and Gonorrhea tests were negative. I recommend condoms. Partner needs to be treated as well so it doesn't spread back and forth.

## 2024-11-14 NOTE — Telephone Encounter (Signed)
 Information has been reported to the [facility] Tammy Knoonce.

## 2024-11-15 LAB — URINE CULTURE
MICRO NUMBER:: 17326194
SPECIMEN QUALITY:: ADEQUATE

## 2024-11-15 NOTE — Telephone Encounter (Signed)
 I have spoken with pt to let her know that the rx is for her

## 2024-11-15 NOTE — Progress Notes (Signed)
 See mychart note Dear Ms. Cappello, Your urine test is fine. No need to treat this as it is a bacteria that canbe dommonly found in the system and does not cause infection. Please complete the Flagyl  treatment for your vaginal infection.  Sincerely, Dr. Jodie
